# Patient Record
Sex: Male | Born: 1935 | Race: White | Hispanic: No | Marital: Married | Smoking: Former smoker
Health system: Southern US, Community
[De-identification: ages and names within clinical notes are randomized; demographics above are authoritative.]

## PROBLEM LIST (undated history)

## (undated) DIAGNOSIS — Z9889 Other specified postprocedural states: Secondary | ICD-10-CM

## (undated) DIAGNOSIS — I10 Essential (primary) hypertension: Secondary | ICD-10-CM

## (undated) DIAGNOSIS — N390 Urinary tract infection, site not specified: Secondary | ICD-10-CM

## (undated) DIAGNOSIS — N400 Enlarged prostate without lower urinary tract symptoms: Secondary | ICD-10-CM

## (undated) DIAGNOSIS — N529 Male erectile dysfunction, unspecified: Secondary | ICD-10-CM

## (undated) DIAGNOSIS — F329 Major depressive disorder, single episode, unspecified: Secondary | ICD-10-CM

## (undated) DIAGNOSIS — R319 Hematuria, unspecified: Secondary | ICD-10-CM

## (undated) DIAGNOSIS — F32A Depression, unspecified: Secondary | ICD-10-CM

## (undated) DIAGNOSIS — N320 Bladder-neck obstruction: Secondary | ICD-10-CM

## (undated) DIAGNOSIS — I2510 Atherosclerotic heart disease of native coronary artery without angina pectoris: Secondary | ICD-10-CM

## (undated) DIAGNOSIS — F0390 Unspecified dementia without behavioral disturbance: Secondary | ICD-10-CM

## (undated) DIAGNOSIS — G629 Polyneuropathy, unspecified: Secondary | ICD-10-CM

## (undated) DIAGNOSIS — R972 Elevated prostate specific antigen [PSA]: Secondary | ICD-10-CM

## (undated) DIAGNOSIS — R339 Retention of urine, unspecified: Secondary | ICD-10-CM

## (undated) DIAGNOSIS — E119 Type 2 diabetes mellitus without complications: Secondary | ICD-10-CM

## (undated) HISTORY — DX: Essential (primary) hypertension: I10

## (undated) HISTORY — DX: Unspecified dementia, unspecified severity, without behavioral disturbance, psychotic disturbance, mood disturbance, and anxiety: F03.90

## (undated) HISTORY — DX: Male erectile dysfunction, unspecified: N52.9

## (undated) HISTORY — PX: CERVICAL DISC SURGERY: SHX588

## (undated) HISTORY — DX: Retention of urine, unspecified: R33.9

## (undated) HISTORY — DX: Major depressive disorder, single episode, unspecified: F32.9

## (undated) HISTORY — DX: Urinary tract infection, site not specified: N39.0

## (undated) HISTORY — DX: Elevated prostate specific antigen (PSA): R97.20

## (undated) HISTORY — DX: Benign prostatic hyperplasia without lower urinary tract symptoms: N40.0

## (undated) HISTORY — DX: Polyneuropathy, unspecified: G62.9

## (undated) HISTORY — DX: Bladder-neck obstruction: N32.0

## (undated) HISTORY — PX: PROSTATE BIOPSY: SHX241

## (undated) HISTORY — PX: OTHER SURGICAL HISTORY: SHX169

## (undated) HISTORY — DX: Type 2 diabetes mellitus without complications: E11.9

## (undated) HISTORY — PX: CARDIAC SURGERY: SHX584

## (undated) HISTORY — DX: Other specified postprocedural states: Z98.890

## (undated) HISTORY — DX: Hematuria, unspecified: R31.9

## (undated) HISTORY — DX: Atherosclerotic heart disease of native coronary artery without angina pectoris: I25.10

## (undated) HISTORY — DX: Depression, unspecified: F32.A

---

## 2002-09-20 ENCOUNTER — Ambulatory Visit (HOSPITAL_COMMUNITY): Admission: RE | Admit: 2002-09-20 | Discharge: 2002-09-20 | Payer: Self-pay | Admitting: Family Medicine

## 2009-03-10 ENCOUNTER — Ambulatory Visit: Payer: Self-pay

## 2011-12-15 ENCOUNTER — Ambulatory Visit: Payer: Self-pay | Admitting: Urology

## 2012-08-12 LAB — URINALYSIS, COMPLETE
Bacteria: NONE SEEN
Blood: NEGATIVE
Ketone: NEGATIVE
Nitrite: NEGATIVE
Ph: 5 (ref 4.5–8.0)
RBC,UR: 1 /HPF (ref 0–5)
Specific Gravity: 1.011 (ref 1.003–1.030)
Squamous Epithelial: NONE SEEN
WBC UR: 10 /HPF (ref 0–5)

## 2012-08-12 LAB — CBC
MCV: 86 fL (ref 80–100)
Platelet: 263 10*3/uL (ref 150–440)
RBC: 4.87 10*6/uL (ref 4.40–5.90)
RDW: 14.1 % (ref 11.5–14.5)

## 2012-08-12 LAB — PRO B NATRIURETIC PEPTIDE: B-Type Natriuretic Peptide: 1617 pg/mL — ABNORMAL HIGH (ref 0–450)

## 2012-08-12 LAB — BASIC METABOLIC PANEL
Anion Gap: 7 (ref 7–16)
Chloride: 107 mmol/L (ref 98–107)
Co2: 27 mmol/L (ref 21–32)
Creatinine: 1.38 mg/dL — ABNORMAL HIGH (ref 0.60–1.30)
EGFR (Non-African Amer.): 47 — ABNORMAL LOW
Glucose: 136 mg/dL — ABNORMAL HIGH (ref 65–99)

## 2012-08-12 LAB — TROPONIN I: Troponin-I: <0.02 ng/mL

## 2012-08-12 LAB — CK TOTAL AND CKMB (NOT AT ARMC): CK-MB: 1.3 ng/mL (ref 0.5–3.6)

## 2012-08-13 LAB — CBC WITH DIFFERENTIAL/PLATELET
Eosinophil %: 2.4 %
HGB: 11.9 g/dL — ABNORMAL LOW (ref 13.0–18.0)
MCH: 28.3 pg (ref 26.0–34.0)
MCHC: 32.9 g/dL (ref 32.0–36.0)
Neutrophil #: 5.6 10*3/uL (ref 1.4–6.5)
Neutrophil %: 74 %
Platelet: 224 10*3/uL (ref 150–440)
RBC: 4.22 10*6/uL — ABNORMAL LOW (ref 4.40–5.90)
RDW: 14 % (ref 11.5–14.5)

## 2012-08-13 LAB — LIPID PANEL
Cholesterol: 82 mg/dL (ref 0–200)
HDL Cholesterol: 21 mg/dL — ABNORMAL LOW (ref 40–60)
Ldl Cholesterol, Calc: 48 mg/dL (ref 0–100)
Triglycerides: 67 mg/dL (ref 0–200)
VLDL Cholesterol, Calc: 13 mg/dL (ref 5–40)

## 2012-08-13 LAB — BASIC METABOLIC PANEL
BUN: 19 mg/dL — ABNORMAL HIGH (ref 7–18)
Calcium, Total: 9.2 mg/dL (ref 8.5–10.1)
Chloride: 108 mmol/L — ABNORMAL HIGH (ref 98–107)
Co2: 29 mmol/L (ref 21–32)
EGFR (African American): >60
Glucose: 139 mg/dL — ABNORMAL HIGH (ref 65–99)
Sodium: 145 mmol/L (ref 136–145)

## 2012-08-13 LAB — TROPONIN I
Troponin-I: <0.02 ng/mL
Troponin-I: <0.02 ng/mL

## 2012-08-14 ENCOUNTER — Inpatient Hospital Stay: Payer: Self-pay | Admitting: Family Medicine

## 2012-08-14 LAB — CBC WITH DIFFERENTIAL/PLATELET
Basophil #: 0.1 10*3/uL (ref 0.0–0.1)
Eosinophil #: 0.3 10*3/uL (ref 0.0–0.7)
Lymphocyte #: 1.2 10*3/uL (ref 1.0–3.6)
Lymphocyte %: 15.6 %
MCH: 28.2 pg (ref 26.0–34.0)
MCV: 86 fL (ref 80–100)
Monocyte #: 0.6 x10 3/mm (ref 0.2–1.0)
Monocyte %: 7.7 %
Neutrophil #: 5.8 10*3/uL (ref 1.4–6.5)
RBC: 4.3 10*6/uL — ABNORMAL LOW (ref 4.40–5.90)
RDW: 14.1 % (ref 11.5–14.5)

## 2012-08-14 LAB — URINE CULTURE

## 2012-08-14 LAB — BASIC METABOLIC PANEL
Anion Gap: 8 (ref 7–16)
BUN: 19 mg/dL — ABNORMAL HIGH (ref 7–18)
Calcium, Total: 8.9 mg/dL (ref 8.5–10.1)
Co2: 30 mmol/L (ref 21–32)
Creatinine: 1.36 mg/dL — ABNORMAL HIGH (ref 0.60–1.30)
EGFR (Non-African Amer.): 48 — ABNORMAL LOW
Glucose: 102 mg/dL — ABNORMAL HIGH (ref 65–99)
Sodium: 143 mmol/L (ref 136–145)

## 2012-08-14 LAB — HEMOGLOBIN A1C: Hemoglobin A1C: 8 % — ABNORMAL HIGH (ref 4.2–6.3)

## 2012-08-15 LAB — BASIC METABOLIC PANEL
Anion Gap: 10 (ref 7–16)
BUN: 16 mg/dL (ref 7–18)
Chloride: 102 mmol/L (ref 98–107)
Creatinine: 1.01 mg/dL (ref 0.60–1.30)
EGFR (African American): >60
Glucose: 135 mg/dL — ABNORMAL HIGH (ref 65–99)
Osmolality: 283 (ref 275–301)

## 2012-08-17 ENCOUNTER — Emergency Department: Payer: Self-pay | Admitting: Emergency Medicine

## 2012-08-17 LAB — COMPREHENSIVE METABOLIC PANEL
Albumin: 3.1 g/dL — ABNORMAL LOW (ref 3.4–5.0)
Alkaline Phosphatase: 32 U/L — ABNORMAL LOW (ref 50–136)
Anion Gap: 3 — ABNORMAL LOW (ref 7–16)
BUN: 26 mg/dL — ABNORMAL HIGH (ref 7–18)
Bilirubin,Total: 0.4 mg/dL (ref 0.2–1.0)
Chloride: 104 mmol/L (ref 98–107)
Co2: 33 mmol/L — ABNORMAL HIGH (ref 21–32)
Creatinine: 1.06 mg/dL (ref 0.60–1.30)
EGFR (African American): >60
EGFR (Non-African Amer.): >60
Glucose: 206 mg/dL — ABNORMAL HIGH (ref 65–99)
SGOT(AST): 24 U/L (ref 15–37)
SGPT (ALT): 26 U/L (ref 12–78)
Sodium: 140 mmol/L (ref 136–145)
Total Protein: 6.4 g/dL (ref 6.4–8.2)

## 2012-08-17 LAB — URINALYSIS, COMPLETE
RBC,UR: 140946 /HPF (ref 0–5)
Squamous Epithelial: NONE SEEN
WBC UR: 326 /HPF (ref 0–5)

## 2012-08-17 LAB — CBC
HCT: 40.2 % (ref 40.0–52.0)
MCV: 86 fL (ref 80–100)
Platelet: 220 10*3/uL (ref 150–440)
RBC: 4.7 10*6/uL (ref 4.40–5.90)
WBC: 8.7 10*3/uL (ref 3.8–10.6)

## 2012-08-21 ENCOUNTER — Emergency Department: Payer: Self-pay | Admitting: Emergency Medicine

## 2012-08-21 LAB — CBC
HCT: 42.8 % (ref 40.0–52.0)
HGB: 14.2 g/dL (ref 13.0–18.0)
MCH: 28.6 pg (ref 26.0–34.0)
MCHC: 33.2 g/dL (ref 32.0–36.0)
MCV: 86 fL (ref 80–100)
Platelet: 215 10*3/uL (ref 150–440)
RBC: 4.97 10*6/uL (ref 4.40–5.90)
WBC: 9.1 10*3/uL (ref 3.8–10.6)

## 2012-08-21 LAB — COMPREHENSIVE METABOLIC PANEL
BUN: 21 mg/dL — ABNORMAL HIGH (ref 7–18)
Bilirubin,Total: 0.7 mg/dL (ref 0.2–1.0)
Chloride: 108 mmol/L — ABNORMAL HIGH (ref 98–107)
Co2: 25 mmol/L (ref 21–32)
Creatinine: 1.08 mg/dL (ref 0.60–1.30)
Glucose: 153 mg/dL — ABNORMAL HIGH (ref 65–99)
Osmolality: 289 (ref 275–301)
SGOT(AST): 27 U/L (ref 15–37)
Sodium: 142 mmol/L (ref 136–145)

## 2012-08-21 LAB — URINALYSIS, COMPLETE
Hyaline Cast: 13
Nitrite: NEGATIVE
Ph: 5 (ref 4.5–8.0)
RBC,UR: 388 /HPF (ref 0–5)
Specific Gravity: 1.015 (ref 1.003–1.030)
Squamous Epithelial: NONE SEEN
WBC UR: 7 /HPF (ref 0–5)

## 2012-08-23 LAB — URINE CULTURE

## 2012-09-25 ENCOUNTER — Ambulatory Visit: Payer: Self-pay | Admitting: Urology

## 2012-10-15 ENCOUNTER — Ambulatory Visit: Payer: Self-pay | Admitting: Urology

## 2012-10-16 LAB — BASIC METABOLIC PANEL
Anion Gap: 6 — ABNORMAL LOW (ref 7–16)
BUN: 19 mg/dL — ABNORMAL HIGH (ref 7–18)
Chloride: 106 mmol/L (ref 98–107)
Co2: 28 mmol/L (ref 21–32)
Creatinine: 1.12 mg/dL (ref 0.60–1.30)
EGFR (African American): >60
EGFR (Non-African Amer.): >60

## 2012-10-16 LAB — HEMOGLOBIN: HGB: 12.9 g/dL — ABNORMAL LOW (ref 13.0–18.0)

## 2012-10-23 ENCOUNTER — Emergency Department: Payer: Self-pay | Admitting: Emergency Medicine

## 2012-10-23 LAB — URINALYSIS, COMPLETE
Bilirubin,UR: NEGATIVE
Glucose,UR: NEGATIVE mg/dL (ref 0–75)
Ketone: NEGATIVE
Ph: 5 (ref 4.5–8.0)
Protein: 30
RBC,UR: 114 /HPF (ref 0–5)
Specific Gravity: 1.013 (ref 1.003–1.030)
Squamous Epithelial: <1
WBC UR: 62 /HPF (ref 0–5)

## 2012-10-27 ENCOUNTER — Emergency Department: Payer: Self-pay | Admitting: Emergency Medicine

## 2012-10-27 LAB — URINALYSIS, COMPLETE
Ketone: NEGATIVE
Ph: 5 (ref 4.5–8.0)
RBC,UR: 541 /HPF (ref 0–5)
Squamous Epithelial: NONE SEEN
WBC UR: 557 /HPF (ref 0–5)

## 2012-11-21 ENCOUNTER — Ambulatory Visit: Payer: Self-pay | Admitting: Anesthesiology

## 2012-11-21 LAB — BASIC METABOLIC PANEL
Chloride: 107 mmol/L (ref 98–107)
Co2: 26 mmol/L (ref 21–32)
Creatinine: 1 mg/dL (ref 0.60–1.30)
Glucose: 206 mg/dL — ABNORMAL HIGH (ref 65–99)
Osmolality: 287 (ref 275–301)
Potassium: 4.3 mmol/L (ref 3.5–5.1)
Sodium: 139 mmol/L (ref 136–145)

## 2012-11-28 ENCOUNTER — Ambulatory Visit: Payer: Self-pay | Admitting: Urology

## 2012-11-28 LAB — CBC WITH DIFFERENTIAL/PLATELET
Basophil %: 0.3 %
Eosinophil %: 0.1 %
HCT: 41.5 % (ref 40.0–52.0)
HGB: 13.7 g/dL (ref 13.0–18.0)
Lymphocyte #: 0.4 10*3/uL — ABNORMAL LOW (ref 1.0–3.6)
Lymphocyte %: 4.4 %
MCH: 28.9 pg (ref 26.0–34.0)
Neutrophil %: 90.4 %
Platelet: 234 10*3/uL (ref 150–440)
RBC: 4.76 10*6/uL (ref 4.40–5.90)
RDW: 14.5 % (ref 11.5–14.5)
WBC: 9.6 10*3/uL (ref 3.8–10.6)

## 2012-11-28 LAB — COMPREHENSIVE METABOLIC PANEL
Albumin: 3.4 g/dL (ref 3.4–5.0)
Alkaline Phosphatase: 30 U/L — ABNORMAL LOW (ref 50–136)
Anion Gap: 8 (ref 7–16)
BUN: 36 mg/dL — ABNORMAL HIGH (ref 7–18)
Bilirubin,Total: 0.8 mg/dL (ref 0.2–1.0)
Co2: 24 mmol/L (ref 21–32)
Creatinine: 1.66 mg/dL — ABNORMAL HIGH (ref 0.60–1.30)
EGFR (African American): 44 — ABNORMAL LOW
Glucose: 504 mg/dL (ref 65–99)
Potassium: 5.3 mmol/L — ABNORMAL HIGH (ref 3.5–5.1)
SGOT(AST): 14 U/L — ABNORMAL LOW (ref 15–37)
SGPT (ALT): 22 U/L (ref 12–78)
Sodium: 138 mmol/L (ref 136–145)

## 2012-11-28 LAB — CK TOTAL AND CKMB (NOT AT ARMC)
CK, Total: 20 U/L — ABNORMAL LOW (ref 35–232)
CK-MB: 0.8 ng/mL (ref 0.5–3.6)

## 2012-11-28 LAB — PROTIME-INR: INR: 1.1

## 2012-11-29 ENCOUNTER — Inpatient Hospital Stay: Payer: Self-pay | Admitting: Family Medicine

## 2012-11-29 LAB — URINALYSIS, COMPLETE
Glucose,UR: >=500 mg/dL (ref 0–75)
Ketone: NEGATIVE
Ph: 5 (ref 4.5–8.0)
Squamous Epithelial: NONE SEEN

## 2012-11-29 LAB — HEMOGLOBIN A1C: Hemoglobin A1C: 8.6 % — ABNORMAL HIGH (ref 4.2–6.3)

## 2012-11-30 LAB — BASIC METABOLIC PANEL
Anion Gap: 5 — ABNORMAL LOW (ref 7–16)
BUN: 27 mg/dL — ABNORMAL HIGH (ref 7–18)
Chloride: 111 mmol/L — ABNORMAL HIGH (ref 98–107)
Co2: 25 mmol/L (ref 21–32)
Creatinine: 0.95 mg/dL (ref 0.60–1.30)
EGFR (Non-African Amer.): >60
Potassium: 3.9 mmol/L (ref 3.5–5.1)

## 2012-11-30 LAB — CBC WITH DIFFERENTIAL/PLATELET
Eosinophil %: 1.1 %
HCT: 30.8 % — ABNORMAL LOW (ref 40.0–52.0)
HGB: 10.6 g/dL — ABNORMAL LOW (ref 13.0–18.0)
Lymphocyte #: 0.9 10*3/uL — ABNORMAL LOW (ref 1.0–3.6)
Lymphocyte %: 9.9 %
MCH: 29.7 pg (ref 26.0–34.0)
MCHC: 34.3 g/dL (ref 32.0–36.0)
MCV: 87 fL (ref 80–100)
Monocyte #: 0.4 x10 3/mm (ref 0.2–1.0)
Neutrophil #: 7.6 10*3/uL — ABNORMAL HIGH (ref 1.4–6.5)
Neutrophil %: 84.4 %
Platelet: 197 10*3/uL (ref 150–440)
RDW: 14.7 % — ABNORMAL HIGH (ref 11.5–14.5)
WBC: 9 10*3/uL (ref 3.8–10.6)

## 2012-12-01 ENCOUNTER — Ambulatory Visit: Payer: Self-pay | Admitting: Urology

## 2012-12-01 LAB — CBC WITH DIFFERENTIAL/PLATELET
Basophil #: 0.1 10*3/uL (ref 0.0–0.1)
Basophil %: 0.5 %
HCT: 32.3 % — ABNORMAL LOW (ref 40.0–52.0)
Lymphocyte #: 1.1 10*3/uL (ref 1.0–3.6)
Lymphocyte %: 10.6 %
MCH: 30.1 pg (ref 26.0–34.0)
MCV: 86 fL (ref 80–100)
Monocyte #: 0.5 x10 3/mm (ref 0.2–1.0)
Neutrophil #: 8.3 10*3/uL — ABNORMAL HIGH (ref 1.4–6.5)
Platelet: 191 10*3/uL (ref 150–440)
RBC: 3.75 10*6/uL — ABNORMAL LOW (ref 4.40–5.90)

## 2012-12-01 LAB — BASIC METABOLIC PANEL
Anion Gap: 7 (ref 7–16)
BUN: 21 mg/dL — ABNORMAL HIGH (ref 7–18)
Chloride: 109 mmol/L — ABNORMAL HIGH (ref 98–107)
Co2: 25 mmol/L (ref 21–32)
Creatinine: 0.78 mg/dL (ref 0.60–1.30)
EGFR (Non-African Amer.): >60
Osmolality: 286 (ref 275–301)
Potassium: 3.4 mmol/L — ABNORMAL LOW (ref 3.5–5.1)

## 2012-12-02 LAB — BASIC METABOLIC PANEL
BUN: 15 mg/dL (ref 7–18)
Calcium, Total: 7.8 mg/dL — ABNORMAL LOW (ref 8.5–10.1)
Chloride: 109 mmol/L — ABNORMAL HIGH (ref 98–107)
EGFR (African American): >60
Osmolality: 281 (ref 275–301)
Potassium: 3.5 mmol/L (ref 3.5–5.1)
Sodium: 138 mmol/L (ref 136–145)

## 2012-12-02 LAB — CBC WITH DIFFERENTIAL/PLATELET
Basophil #: 0 10*3/uL (ref 0.0–0.1)
Basophil %: 0.6 %
HCT: 30 % — ABNORMAL LOW (ref 40.0–52.0)
HGB: 10.5 g/dL — ABNORMAL LOW (ref 13.0–18.0)
Lymphocyte #: 0.9 10*3/uL — ABNORMAL LOW (ref 1.0–3.6)
MCHC: 34.9 g/dL (ref 32.0–36.0)
MCV: 86 fL (ref 80–100)
Monocyte %: 9.1 %
Neutrophil #: 5 10*3/uL (ref 1.4–6.5)
Platelet: 193 10*3/uL (ref 150–440)
WBC: 6.8 10*3/uL (ref 3.8–10.6)

## 2012-12-02 LAB — URINE CULTURE

## 2012-12-03 LAB — BASIC METABOLIC PANEL
Anion Gap: 4 — ABNORMAL LOW (ref 7–16)
BUN: 11 mg/dL (ref 7–18)
Calcium, Total: 8.2 mg/dL — ABNORMAL LOW (ref 8.5–10.1)
Chloride: 108 mmol/L — ABNORMAL HIGH (ref 98–107)
EGFR (African American): >60
EGFR (Non-African Amer.): >60

## 2012-12-03 LAB — CBC WITH DIFFERENTIAL/PLATELET
Eosinophil #: 0.3 10*3/uL (ref 0.0–0.7)
Eosinophil %: 3.7 %
HCT: 31.9 % — ABNORMAL LOW (ref 40.0–52.0)
HGB: 10.9 g/dL — ABNORMAL LOW (ref 13.0–18.0)
Lymphocyte #: 1.2 10*3/uL (ref 1.0–3.6)
Lymphocyte %: 16.4 %
MCHC: 34.2 g/dL (ref 32.0–36.0)
MCV: 85 fL (ref 80–100)
Monocyte #: 0.8 x10 3/mm (ref 0.2–1.0)
Monocyte %: 10.2 %
Neutrophil %: 68.8 %
Platelet: 222 10*3/uL (ref 150–440)
RBC: 3.76 10*6/uL — ABNORMAL LOW (ref 4.40–5.90)
RDW: 14.2 % (ref 11.5–14.5)
WBC: 7.5 10*3/uL (ref 3.8–10.6)

## 2012-12-04 LAB — BASIC METABOLIC PANEL
Anion Gap: 6 — ABNORMAL LOW (ref 7–16)
BUN: 7 mg/dL (ref 7–18)
Calcium, Total: 8.5 mg/dL (ref 8.5–10.1)
Co2: 28 mmol/L (ref 21–32)
EGFR (Non-African Amer.): >60
Glucose: 110 mg/dL — ABNORMAL HIGH (ref 65–99)
Osmolality: 284 (ref 275–301)
Potassium: 3.4 mmol/L — ABNORMAL LOW (ref 3.5–5.1)
Sodium: 143 mmol/L (ref 136–145)

## 2012-12-05 LAB — BASIC METABOLIC PANEL
BUN: 9 mg/dL (ref 7–18)
Chloride: 104 mmol/L (ref 98–107)
Glucose: 142 mg/dL — ABNORMAL HIGH (ref 65–99)
Osmolality: 277 (ref 275–301)
Potassium: 3.8 mmol/L (ref 3.5–5.1)
Sodium: 138 mmol/L (ref 136–145)

## 2013-10-29 DIAGNOSIS — E669 Obesity, unspecified: Secondary | ICD-10-CM | POA: Insufficient documentation

## 2013-10-29 DIAGNOSIS — E785 Hyperlipidemia, unspecified: Secondary | ICD-10-CM | POA: Insufficient documentation

## 2013-10-29 DIAGNOSIS — E119 Type 2 diabetes mellitus without complications: Secondary | ICD-10-CM | POA: Insufficient documentation

## 2013-10-29 DIAGNOSIS — M5030 Other cervical disc degeneration, unspecified cervical region: Secondary | ICD-10-CM | POA: Insufficient documentation

## 2013-10-29 DIAGNOSIS — G64 Other disorders of peripheral nervous system: Secondary | ICD-10-CM | POA: Insufficient documentation

## 2013-10-29 DIAGNOSIS — N529 Male erectile dysfunction, unspecified: Secondary | ICD-10-CM | POA: Insufficient documentation

## 2013-10-29 DIAGNOSIS — K5790 Diverticulosis of intestine, part unspecified, without perforation or abscess without bleeding: Secondary | ICD-10-CM | POA: Insufficient documentation

## 2013-10-29 DIAGNOSIS — F0390 Unspecified dementia without behavioral disturbance: Secondary | ICD-10-CM | POA: Insufficient documentation

## 2013-10-29 DIAGNOSIS — I25810 Atherosclerosis of coronary artery bypass graft(s) without angina pectoris: Secondary | ICD-10-CM | POA: Insufficient documentation

## 2014-01-01 ENCOUNTER — Emergency Department: Payer: Self-pay | Admitting: Internal Medicine

## 2014-02-10 ENCOUNTER — Ambulatory Visit: Payer: Self-pay | Admitting: Urology

## 2014-02-10 LAB — CBC
HCT: 45.9 % (ref 40.0–52.0)
HGB: 14.6 g/dL (ref 13.0–18.0)
MCH: 28.1 pg (ref 26.0–34.0)
MCHC: 31.8 g/dL — AB (ref 32.0–36.0)
MCV: 88 fL (ref 80–100)
PLATELETS: 224 10*3/uL (ref 150–440)
RBC: 5.19 10*6/uL (ref 4.40–5.90)
RDW: 13.7 % (ref 11.5–14.5)
WBC: 6.6 10*3/uL (ref 3.8–10.6)

## 2014-02-10 LAB — BASIC METABOLIC PANEL
Anion Gap: 5 — ABNORMAL LOW (ref 7–16)
BUN: 16 mg/dL (ref 7–18)
Calcium, Total: 9.1 mg/dL (ref 8.5–10.1)
Chloride: 103 mmol/L (ref 98–107)
Co2: 28 mmol/L (ref 21–32)
Creatinine: 1.24 mg/dL (ref 0.60–1.30)
GFR CALC NON AF AMER: 53 — AB
GLUCOSE: 123 mg/dL — AB (ref 65–99)
Osmolality: 275 (ref 275–301)
POTASSIUM: 4.8 mmol/L (ref 3.5–5.1)
Sodium: 136 mmol/L (ref 136–145)

## 2014-02-17 LAB — COMPREHENSIVE METABOLIC PANEL
ALT: 19 U/L
AST: 16 U/L (ref 15–37)
Albumin: 3.2 g/dL — ABNORMAL LOW (ref 3.4–5.0)
Alkaline Phosphatase: 20 U/L — ABNORMAL LOW
Anion Gap: 9 (ref 7–16)
BUN: 20 mg/dL — ABNORMAL HIGH (ref 7–18)
Bilirubin,Total: 0.7 mg/dL (ref 0.2–1.0)
Calcium, Total: 9 mg/dL (ref 8.5–10.1)
Chloride: 103 mmol/L (ref 98–107)
Co2: 26 mmol/L (ref 21–32)
Creatinine: 1.16 mg/dL (ref 0.60–1.30)
EGFR (Non-African Amer.): 58 — ABNORMAL LOW
GLUCOSE: 99 mg/dL (ref 65–99)
OSMOLALITY: 278 (ref 275–301)
Potassium: 4.3 mmol/L (ref 3.5–5.1)
Sodium: 138 mmol/L (ref 136–145)
TOTAL PROTEIN: 6.7 g/dL (ref 6.4–8.2)

## 2014-02-17 LAB — CBC WITH DIFFERENTIAL/PLATELET
Basophil #: 0.1 10*3/uL (ref 0.0–0.1)
Basophil %: 0.7 %
EOS ABS: 0.1 10*3/uL (ref 0.0–0.7)
EOS PCT: 0.8 %
HCT: 42.4 % (ref 40.0–52.0)
HGB: 13.7 g/dL (ref 13.0–18.0)
Lymphocyte #: 1.2 10*3/uL (ref 1.0–3.6)
Lymphocyte %: 13.6 %
MCH: 28.6 pg (ref 26.0–34.0)
MCHC: 32.4 g/dL (ref 32.0–36.0)
MCV: 88 fL (ref 80–100)
MONOS PCT: 9.2 %
Monocyte #: 0.8 x10 3/mm (ref 0.2–1.0)
Neutrophil #: 6.7 10*3/uL — ABNORMAL HIGH (ref 1.4–6.5)
Neutrophil %: 75.7 %
Platelet: 175 10*3/uL (ref 150–440)
RBC: 4.8 10*6/uL (ref 4.40–5.90)
RDW: 14.1 % (ref 11.5–14.5)
WBC: 8.9 10*3/uL (ref 3.8–10.6)

## 2014-02-17 LAB — TSH: THYROID STIMULATING HORM: 2.48 u[IU]/mL

## 2014-02-17 LAB — TROPONIN I: Troponin-I: <0.02 ng/mL

## 2014-02-18 ENCOUNTER — Inpatient Hospital Stay: Payer: Self-pay | Admitting: Family Medicine

## 2014-02-18 LAB — URINALYSIS, COMPLETE
Bilirubin,UR: NEGATIVE
GLUCOSE, UR: NEGATIVE mg/dL (ref 0–75)
Ketone: NEGATIVE
NITRITE: NEGATIVE
PH: 5 (ref 4.5–8.0)
Protein: 100
RBC,UR: 4879 /HPF (ref 0–5)
Specific Gravity: 1.015 (ref 1.003–1.030)
Squamous Epithelial: NONE SEEN
WBC UR: 96 /HPF (ref 0–5)

## 2014-02-19 LAB — BASIC METABOLIC PANEL
Anion Gap: 9 (ref 7–16)
BUN: 18 mg/dL (ref 7–18)
CALCIUM: 8.6 mg/dL (ref 8.5–10.1)
CO2: 24 mmol/L (ref 21–32)
Chloride: 103 mmol/L (ref 98–107)
Creatinine: 0.94 mg/dL (ref 0.60–1.30)
EGFR (Non-African Amer.): >60
GLUCOSE: 96 mg/dL (ref 65–99)
OSMOLALITY: 274 (ref 275–301)
Potassium: 4 mmol/L (ref 3.5–5.1)
Sodium: 136 mmol/L (ref 136–145)

## 2014-02-19 LAB — CBC WITH DIFFERENTIAL/PLATELET
BASOS ABS: 0.1 10*3/uL (ref 0.0–0.1)
BASOS PCT: 0.7 %
EOS ABS: 0.2 10*3/uL (ref 0.0–0.7)
Eosinophil %: 2.5 %
HCT: 41.2 % (ref 40.0–52.0)
HGB: 13.2 g/dL (ref 13.0–18.0)
LYMPHS ABS: 1.6 10*3/uL (ref 1.0–3.6)
LYMPHS PCT: 20.4 %
MCH: 28.5 pg (ref 26.0–34.0)
MCHC: 32.1 g/dL (ref 32.0–36.0)
MCV: 89 fL (ref 80–100)
Monocyte #: 0.5 x10 3/mm (ref 0.2–1.0)
Monocyte %: 6.3 %
NEUTROS PCT: 70.1 %
Neutrophil #: 5.4 10*3/uL (ref 1.4–6.5)
PLATELETS: 154 10*3/uL (ref 150–440)
RBC: 4.65 10*6/uL (ref 4.40–5.90)
RDW: 14 % (ref 11.5–14.5)
WBC: 7.7 10*3/uL (ref 3.8–10.6)

## 2014-02-20 LAB — CBC WITH DIFFERENTIAL/PLATELET
Basophil #: 0.1 10*3/uL (ref 0.0–0.1)
Basophil %: 0.7 %
Eosinophil #: 0 10*3/uL (ref 0.0–0.7)
Eosinophil %: 0.5 %
HCT: 37.2 % — ABNORMAL LOW (ref 40.0–52.0)
HGB: 12.7 g/dL — ABNORMAL LOW (ref 13.0–18.0)
Lymphocyte #: 1.1 10*3/uL (ref 1.0–3.6)
Lymphocyte %: 15.1 %
MCH: 29.6 pg (ref 26.0–34.0)
MCHC: 34 g/dL (ref 32.0–36.0)
MCV: 87 fL (ref 80–100)
Monocyte #: 1.1 x10 3/mm — ABNORMAL HIGH (ref 0.2–1.0)
Monocyte %: 15.2 %
Neutrophil #: 5 10*3/uL (ref 1.4–6.5)
Neutrophil %: 68.5 %
Platelet: 145 10*3/uL — ABNORMAL LOW (ref 150–440)
RBC: 4.28 10*6/uL — ABNORMAL LOW (ref 4.40–5.90)
RDW: 13.6 % (ref 11.5–14.5)
WBC: 7.3 10*3/uL (ref 3.8–10.6)

## 2014-02-20 LAB — BASIC METABOLIC PANEL
Anion Gap: 7 (ref 7–16)
BUN: 18 mg/dL (ref 7–18)
Calcium, Total: 8.1 mg/dL — ABNORMAL LOW (ref 8.5–10.1)
Chloride: 107 mmol/L (ref 98–107)
Co2: 23 mmol/L (ref 21–32)
Creatinine: 0.93 mg/dL (ref 0.60–1.30)
EGFR (Non-African Amer.): >60
Glucose: 143 mg/dL — ABNORMAL HIGH (ref 65–99)
Osmolality: 278 (ref 275–301)
Potassium: 3.9 mmol/L (ref 3.5–5.1)
Sodium: 137 mmol/L (ref 136–145)

## 2014-02-21 LAB — URINE CULTURE

## 2014-04-08 ENCOUNTER — Ambulatory Visit: Payer: Self-pay | Admitting: Urology

## 2014-04-08 LAB — CBC WITH DIFFERENTIAL/PLATELET
Basophil #: 0.1 10*3/uL (ref 0.0–0.1)
Basophil %: 1 %
Eosinophil #: 0.1 10*3/uL (ref 0.0–0.7)
Eosinophil %: 1.7 %
HCT: 44.3 % (ref 40.0–52.0)
HGB: 14.4 g/dL (ref 13.0–18.0)
LYMPHS ABS: 1.3 10*3/uL (ref 1.0–3.6)
LYMPHS PCT: 18.6 %
MCH: 28.2 pg (ref 26.0–34.0)
MCHC: 32.4 g/dL (ref 32.0–36.0)
MCV: 87 fL (ref 80–100)
Monocyte #: 0.6 x10 3/mm (ref 0.2–1.0)
Monocyte %: 8.1 %
Neutrophil #: 4.9 10*3/uL (ref 1.4–6.5)
Neutrophil %: 70.6 %
PLATELETS: 253 10*3/uL (ref 150–440)
RBC: 5.1 10*6/uL (ref 4.40–5.90)
RDW: 14.5 % (ref 11.5–14.5)
WBC: 7 10*3/uL (ref 3.8–10.6)

## 2014-04-09 ENCOUNTER — Ambulatory Visit: Payer: Self-pay | Admitting: Urology

## 2014-06-02 DIAGNOSIS — I10 Essential (primary) hypertension: Secondary | ICD-10-CM | POA: Insufficient documentation

## 2014-11-07 NOTE — Consult Note (Signed)
Brief Consult Note: Diagnosis: Urinary Retention/SP tube/Penoscrotal edema.   Patient was seen by consultant.   Recommend further assessment or treatment.   Orders entered.   Discussed with Attending MD.   Comments: SP balloon outside of bladder, repositioned by me.  Irrigated easily small clot burden from bladder.  Cont. as is to drainage.  Routine dressing and skin care. Will add in anticholinergic to limit leakage around foley.  D/w Sparks.  Electronic Signatures: Charyl BiggerHouser, Neave Lenger Ross (MD)  (Signed 17-May-14 12:14)  Authored: Brief Consult Note   Last Updated: 17-May-14 12:14 by Charyl BiggerHouser, Gillis Boardley Ross (MD)

## 2014-11-07 NOTE — Consult Note (Signed)
PATIENT NAME:  Zachary [name], HUDNALL MR#:  [id number] DATE OF BIRTH:  07/11/1927  DATE OF CONSULTATION:  12/01/2012  CONSULTING PHYSICIAN:  Tawni Millers. Rahel Carlton, II, MD  REASON FOR CONSULTATION: Penoscrotal edema and leakage around suprapubic tube.   REQUESTING PHYSICIAN:  Dr. Judithann Sheen    HISTORY OF PRESENT ILLNESS:  The patient is an 81 year old male known to [facility]. He has been previously under the care of urologist Dr. Edwyna Shell, who had laser and transurethral resection of the prostate on 10/15/2012, had some postprocedural bleeding and ultimately underwent a suprapubic catheter secondary to urinary retention and outlet obstruction on May 14. At home he had a fever. He was brought back to the [facility] ER after having too high blood sugars and ultimately admitted to the  hospitalist service with blood sugar excess of 500. He is demented and is unable to give his history, but subsequently in the hospital his sugars have been under control, he has been afebrile, he has a normal white count. However, he has been leaking around his catheter and developed a significant amount of penoscrotal edema. On admission, CT scan showed that his suprapubic catheter had been advanced distally too far into the prostate. It was subsequently moved back. I am consulted regarding leakage around the suprapubic tube currently.   PAST MEDICAL HISTORY:   1.  Hypertension. 2.  Hyperlipidemia. 3.  Diabetes mellitus. 4.  Coronary artery disease. 5.  Dementia. 6.  Prostatic hypertrophy. 7.  Abdominal aortic aneurysm.    PAST SURGICAL HISTORY:   1.  Coronary artery bypass grafting.  2.  Ventral hernia.  3.  TURP/TUVP.  4.  Suprapubic tube placement.   ALLERGIES:  No known drug allergies.   OUTPATIENT MEDICATIONS:  1.  Simvastatin.  2.  Potassium.  3.  Metoprolol.  4.  Metformin. 5.  Lisinopril.  6.  Lantus. 7.  Januvia. 8.  Glipizide. 9.  Lasix.  10.  Donepezil.  11.  Bactrim.   SOCIAL HISTORY:   No history of smoking or drinking. He is retired.   FAMILY HISTORY:  Noncontributory.   REVIEW OF SYSTEMS:  The patient denies any pain currently. He denies any suprapubic pain. He denies any history of urinary complaints. Ultimately, his review of systems is unreliable.   PHYSICAL EXAMINATION: GENERAL:  He is an alert Caucasian male, oriented x 1.  CURRENT VITAL SIGNS ARE AS FOLLOWS:  Temperature 36.6, pulse 65, respiratory rate 20, blood pressure 150/78, satting 95% on room air  HEENT:  Normocephalic, atraumatic, nonicteric.  NECK:  Supple, without any lymphadenopathy.  CHEST:  Nonlabored.  CARDIOVASCULAR:  Regular rate and rhythm. Has 2+ upper and lower extremity pulses x 4.  ABDOMEN:  Soft. Mildly distended suprapubically. Suprapubic tube appears to be several centimeters above his suprapubic area. It is not red. He does have a fair amount of soft tissue edema. He is non-peritoneal.  GENITOURINARY:  He has moderate penoscrotal edema.  Unable to retract  the foreksin to visualize the glans. He has moderate scrotal edema as well. Scrotal exam is otherwise unremarkable, with no erythema. No crepitus. He does have a mild amount of soft tissue induration due to the edema.  MUSCULOSKELETAL:  Five out of five strength.  SKIN:  No other rashes or lesions noted, except as listed in the GU exam.  LYMPHATIC:  No lymphadenopathy.  NEUROLOGIC: Cranial nerves appear grossly intact.  EXTREMITIES:  He does have moderate lower extremity edema.   LABORATORY VALUES:  Creatinine is 0.78, white  count is 9.6.   IMAGING: CT scan from admission and CT scan from today: Both report and images are reviewed by myself. CT scan today shows a suprapubic catheter balloon appearing to lie outside the bladder with the distal tip of the catheter in the bladder.   ASSESSMENT:  An 81 year old with history of bladder outlet obstruction and benign prostatic hypertrophy. He is status post transurethral resection of the  prostate and now suprapubic tube. His suprapubic tube was redressed in the ER, and is likely now no longer having the balloon within the bladder, which explains the leakage around the tube, but also complains the normal output, given that the tip with the distal end remains in the bladder. I elected to deflate the tube and advance it 5 cm into the bladder after reducing the balloon. I then reinflated the balloon and it irrigated appropriately. I irrigated it multiple times with 60 mL of sterile saline, resulting  in  getting a small amount of clot burden from the patient. It appears that the suprapubic tube now is within the bladder as appropriate, and the nurse will probably redress this. His penoscrotal edema is consistent with his overall health status and total body fluid overload. There is no evidence of infection or Fournier's. He has no concern for this by history, exam or imaging.   RECOMMENDATIONS ARE AS FOLLOWS:  1.  Suprapubic tube to gravity bag drainage, with routine care.  2.  Will add an anticholinergic for bladder relaxation, as patient with a history of bladder outlet obstruction and leakage around his suprapubic tube.  3.  Continue routine skin care for his penoscrotal edema with scrotal elevation.  This plan was discussed with Dr. Judithann SheenSparks.       ____________________________ Tawni MillersEdward R. Weldon InchesHouser, II, MD erh:mr D: 12/01/2012 12:26:00 ET T: 12/01/2012 20:22:00 ET JOB#: [id number]  cc: Tawni MillersEdward R. Weldon InchesHouser, II, MD, <Dictator> Beaulah CorinEDWARD R Wallace Gappa MD ELECTRONICALLY SIGNED 12/23/2012 11:56

## 2014-11-07 NOTE — Discharge Summary (Signed)
PATIENT NAME:  Zachary [name], Sie A MR#:  [id number] DATE OF BIRTH:  1926-08-31  DATE OF ADMISSION: 08/12/2012  DATE OF DISCHARGE: 08/15/2012   DISCHARGE DIAGNOSES:  1. Urinary retention, requiring Foley catheter placement.  2. Benign prostatic hypertrophy.  3. Urinary tract infection.  4. Systolic congestive heart failure with ejection fraction of 30%. This is a new diagnosis.  5. History of coronary artery disease.  6. Adult-onset diabetes.  7. Dementia.   DISCHARGE MEDICATIONS:  1. Glipizide 10 mg p.o. b.i.d. with meals.  2. Metformin 1000 mg 1 tab with breakfast, 1/2 tablet lunch, 1 tablet dinner.  3. Januvia 100 mg p.o. daily.  4. Niacin 500 mg tabs, 3 tabs p.o. at bedtime.  5. Lisinopril 5 mg p.o. daily.  6. Simvastatin 20 mg p.o. at bedtime.  7. Flomax 0.4 mg p.o. daily.  8. Finasteride 5 mg p.o. daily.  9. Lantus 20 units subcutaneous injection daily.  10. Donepezil 10 mg p.o. at bedtime.  11. Imdur 30 mg p.o. daily.  12. Aspirin 81 mg p.o. at bedtime.  13. Metoprolol 25 mg tabs 1/2-tab p.o. b.i.d.  14. Furosemide 20 mg p.o. daily.  15. Potassium 20 mEq p.o. daily.  16. Levofloxacin 250 mg p.o. daily for 4 more days.  MEDICATIONS TO STOP: The Toviaz and the buffered aspirin.   CONSULTS: None.   PROCEDURES: The patient did have a Foley catheter placed.   PERTINENT LABORATORY: On the day of discharge, sodium 140, potassium 3, BUN of 16, creatinine 1.01, glucose of 135. The patient had an echocardiogram done that showed an EF of 30%. LDL 48, HDL 21. Troponins were negative x3. Urinalysis showed 1+ leukocyte esterase and 10 white blood cell count on micro. Ultrasound was negative for DVT. Renal ultrasound showed mild bilateral hydronephrosis with enlarged prostate and distended bladder.   BRIEF HOSPITAL COURSE:  1. Urinary retention. The patient initially came in with complaints of anasarca and urinary retention with history of BPH and recently placed on Toviaz because of  urge incontinence. The Gala Murdochoviaz was stopped. A Foley catheter was placed, and he produced quite a volume of urine. Continue with his BPH medications. Placed him on Lasix because of an elevated BNP and his diffuse edema, and he diuresed appropriately. Plan is to keep the Foley in place [facility] Dr. Lennie OdorScales. We did give him a trial off of the Foley, and he did continued to retain. His renal ultrasound, as stated above, was consistent with bilateral hydronephrosis with an enlarged prostate and distended bladder. Further treatment pending [facility].  2. UTI. The patient was noted to have urinalysis consistent with a UTI. He was placed on Levaquin with a total dose of 7 days of Levaquin 250 mg daily, likely secondary to the urinary retention.  3. Systolic congestive heart failure with an EF of 30%. This is a new diagnosis. He was found to have generalized edema with elevated BNP. Echocardiogram was performed, and it did show EF of 30%. He is on a beta blocker, an ACE inhibitor and an aspirin, and he is on Lasix for comfort. Will need to cover him with potassium chloride because of his low potassium while he is on the Lasix. He is otherwise stable at this time. I asked his family to monitor the weight, his breathing status and also his fluid status.  4. Other chronic medical issues remain stable at this time. No changes to those regimens.   DISPOSITION: He will need home  health nursing to manage the Foley. Also will need home health PT because of his instability. I did spend greater than 30 minutes speaking with the family, discussing his status and also his home care. They are comfortable with taking him home.  TOTAL DISCHARGE TIME: Greater than 30 minutes.   FOLLOWUP: He is to follow up with [facility] on 08/30/2012 at 2:00 p.m. per Dr. Lennie Odor. He is to follow up with Dr. Burnadette Pop within 2 weeks.    ____________________________ Marisue Ivan, MD kl:OSi D: 08/15/2012 08:12:00  ET T: 08/15/2012 08:25:02 ET JOB#: [id number]  cc: Marisue Ivan, MD, <Dictator> Marisue Ivan MD ELECTRONICALLY SIGNED 09/11/2012 14:21
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
## 2014-11-07 NOTE — Op Note (Signed)
PATIENT NAME:  Zachary [name], Zachary [name] MR#:  [id number] DATE OF BIRTH:  Sep 07, 1926  DATE OF PROCEDURE:  10/15/2012  PREOPERATIVE DIAGNOSIS: Vesicle outlet obstruction secondary to benign prostatic hypertrophy.  POSTOPERATIVE DIAGNOSIS: Vesicle outlet obstruction secondary to benign prostatic hypertrophy.  PROCEDURES PERFORMED: KTP laser, transurethral resection of the prostate and regular resectoscope transurethral resection of the prostate.  SURGEON:  Richard D. Edwyna ShellHart, DO  COMPLICATIONS:  The patient was very, very hemorrhagic throughout the procedure. Every cut I made had 2 or 3 major bleeders so I had [name] limited TURP at best.  ANESTHESIA: General.  PROCEDURE AND FINDINGS:  The patient was sterilely prepped and draped, in the supine lithotomy position, for ease of approach to the external genitalia. The procedure begins.  I insert the KTP laser sheath and scope in an attempt to do KTP laser. With the first 2 passes bleeding occurs and I unable to see. Therefore, I abandon this portion of the procedure and use [name] standard Storz 26 Iglesias resectoscope for resection of the prostate and control of bleeding. Bleeders were controlled and we resect.  Every cut of the 8 or 9 cuts I have to make to open [name] channel had excessive bleeding beyond the normal. The bleeding was controllable, although he just bled every portion of the cut and bled very, very stringently. I was able to control the bleeding without trouble and evacuate what fragments I had removed. The verumontanum was kept in site throughout. There was no incursion upon the veru.  The patient tolerated the procedure well in this form and I inserted [name] 24-French 3-way Foley. I have to put 50 mL in the balloon. I get clear urine after this. So the patient is sent to recovery in satisfactory condition with clear urine. [name] B and O suppository is inserted. The prostate is smooth and I can feel the prostate in the balloon      easily. The patient is sent to  recovery in satisfactory condition with constant bladder irrigation with normal saline through [name] 24-French 3-way standard Foley. ____________________________ Caralyn Guileichard D. Edwyna ShellHart, DO rdh:sb D: 10/15/2012 13:15:40 ET T: 10/15/2012 13:44:39 ET JOB#: [id number]  cc: Caralyn Guileichard D. Edwyna ShellHart, DO, <Dictator> RICHARD D HART DO ELECTRONICALLY SIGNED 11/01/2012 15:15

## 2014-11-07 NOTE — Consult Note (Signed)
PATIENT NAME:  Zachary [name], Zachary [name] MR#:  [id number] DATE OF BIRTH:  1927/05/16  DATE OF CONSULTATION:  10/16/2012  REFERRING PHYSICIAN:   CONSULTING PHYSICIAN:  Caralyn Guileichard D. Edwyna ShellHart, DO  HISTORY: He is postoperative day #1. His glucose is elevated to 309, BUN is 19, creatinine 1.1. Hemoglobin 12.9. He is sitting in the chair stable. Urine is clear. He has clear urine at [name] very low rate of irrigation. We are going to decrease the irrigation today to 0. I will also decrease the balloon size. We are going to need to institute orders for insulin. Otherwise, I am pleased with his progress. He has had to have [name] monitor because he keeps trying to pull his IV and his Foley out. So, he is having [name] person in the room as an aid constantly. Physically, his abdomen is soft. He does not communicate well at all. He has advancing dementia. So, we are going to stop the irrigation today, just put him to gravity drainage, and I will institute some blood glucose monitoring.     ____________________________ Caralyn Guileichard D. Edwyna ShellHart, DO rdh:es D: 10/16/2012 07:40:16 ET T: 10/16/2012 08:37:30 ET JOB#: [id number]  cc: Caralyn Guileichard D. Edwyna ShellHart, DO, <Dictator> Danny Yackley D Alajiah Dutkiewicz DO ELECTRONICALLY SIGNED 11/01/2012 15:15

## 2014-11-07 NOTE — Discharge Summary (Signed)
PATIENT NAME:  Zachary [name], Zachary [name] MR#:  [id number] DATE OF BIRTH:  Jun 02, 1927  DATE OF ADMISSION:  10/15/2012 DATE OF DISCHARGE:  10/17/2012  HOSPITAL COURSE: Second postop day. TURP done on Monday morning.  The patient had quite [name] bit of bleeding during the procedure, but postoperatively he is cleared within 48 hours to gravity drainage. I am going to remove his Foley to see if he can void today. The patient has had 1 minor complication. His temperature is 100.1. I do not know the origin of this.  Lungs have some rales, but the abdomen is soft.  He is awake and in his usual state of non-alertness and slowly advancing dementia. We are going to remove the Foley, give him Rocephin grams 1. We have been trying to control his blood sugars with insulin. We have had some success with this. He continues his potassium. Otherwise, his other vital signs are normal. Abdomen is soft.  There is no distention, no apparent problem, and good urine output of 800 mL per shift.   PLAN:  To remove the Foley today. If he cannot void, put [name] Foley back in and send him back to the nursing home.   ____________________________ Caralyn Guileichard D. Edwyna ShellHart, DO rdh:sb D: 10/17/2012 07:14:00 ET T: 10/17/2012 07:37:14 ET JOB#: [id number]  cc: Caralyn Guileichard D. Edwyna ShellHart, DO, <Dictator> RICHARD D HART DO ELECTRONICALLY SIGNED 11/01/2012 15:15

## 2014-11-07 NOTE — Op Note (Signed)
PATIENT NAME:  Zachary [name], [name] MR#:  [id number] DATE OF BIRTH:  04-Jul-1927  DATE OF PROCEDURE:  11/28/2012  PREOPERATIVE DIAGNOSIS: Chronic urinary retention.  POSTOPERATIVE DIAGNOSES: Chronic urinary retention with vesical outlet obstruction and neurogenic bladder.   PROCEDURE: Cystoscopy, suprapubic cystotomy.  SURGEON:  Shandon Matson D. Edwyna ShellHart, DO  ANESTHESIA: General.   DESCRIPTION OF PROCEDURE: With the patient sterilely prepped and draped in supine lithotomy position, I look in the bladder, fill the bladder with urine through the scope, after emptying the bladder initially. He had quite a bit of pus in his bladder. It is very hard to see in the bladder. I placed a needle in the suprapubic area at a good angle so that it would get urine. Then the suprapubic is placed at the same angle through a Campbell trocar.  The [facility] trocar is difficult to see, and it is because it is very close to the bladder neck. Foley catheter itself extends into the prostatic urethra for a short distance, but it does not go past the verumontanum. The balloon is right at the bladder neck. The patient tolerates this well. Hopefully, he will have minimal spasm from this catheter inside this particular position in the bladder. The trocar is irrigated, 20 mL is left in the Foley balloon. The trocar is then sutured in place and dressings placed around it. The 2-0 Vicryl was used for suturing. He is sent to recovery in satisfactory condition.    ____________________________ Caralyn Guileichard D. Edwyna ShellHart, DO rdh:OSi D: 11/28/2012 08:16:24 ET T: 11/28/2012 08:38:10 ET JOB#: [id number]  cc: Caralyn Guileichard D. Edwyna ShellHart, DO, <Dictator> Fabiana Dromgoole D Gladys Deckard DO ELECTRONICALLY SIGNED 12/13/2012 17:15

## 2014-11-07 NOTE — Discharge Summary (Signed)
PATIENT NAME:  Zachary DunningsLEXANDER, Alecxis A MR#:  [id number] DATE OF BIRTH:  Oct 22, 1926  DATE OF ADMISSION:  10/15/2012 DATE OF DISCHARGE:  10/18/2012  DISCHARGE DIAGNOSIS: Vesicle outlet obstruction secondary to benign prostatic hypertrophy and diabetic neurogenic bladder.   PROCEDURES: 10/15/2012 KTP laser and normal resection prostatectomy.  HOSPITAL COURSE: The patient was admitted after prostatectomy for control of any hemorrhage. He bled quite a bit during surgery, usually more so than normal. Foley catheter was instituted with balloon in the prostatic fossa and controlled bleeding completely. He never had any postoperative bleeding complications, although when the balloon was let down initially he did have some bleeding, so we had to keep the balloon  at 50 mL of fluid and we slowly but surely reduced the balloon size and the bleeding ceased by the 48 hour mark. He had gravity drainage of clear urine at 48 hours and the Foley was removed. He was unable to void and had a 1200 mL residual and Foley catheter was reinstituted. Hospital course was complicated by the patient's agitation and some early dementia, so we had to have a sitter for him. We did not have to use Haldol. Blood sugars were unstable. He was put on insulin and blood sugar checks. Insulin was given appropriately. He was given his potassium. Metoprolol was also given. We held his lisinopril. His antibiotic course was Rocephin grams 1 postoperatively and preoperatively he had Cipro 500. He had 1 slight temperature during his hospitalization at 100.9, but never had another temperature problem. He was more alert and oriented by the third postop day and asked about his Foley catheter, which was the first real conversation I had with the patient all 3 days. I explained to him that we would in the future think of putting suprapubic catheter in with a plug and he would not have to carry a bag with him and be agitated with a Foley catheter in his urethra.  He is very upset about that. Followup is in my office in 10 to 13 days. Go home on his home meds. No antibiotics are necessary at this time as he is afebrile. There is no evidence of infection at time of procedure. He is in satisfactory condition. Abdomen is soft. Bowel sounds present. He is more alert than he has been in 3 days. His lungs are clear to auscultation and he has no problem with penile edema at this time. Foley is draining fairly clear urine. There are a few dried old clot-like brownish material in the Foley, which is just from clot. He is discharged with this Foley in place and followup is in my office 10 to 13 days to discuss suprapubic catheterization procedure.  ____________________________ Caralyn Guileichard D. Edwyna ShellHart, DO rdh:aw D: 10/18/2012 07:34:54 ET T: 10/18/2012 07:58:06 ET JOB#: [id number]  cc: Caralyn Guileichard D. Edwyna ShellHart, DO, <Dictator> Derionna Salvador D Aurorah Schlachter DO ELECTRONICALLY SIGNED 11/01/2012 15:16
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
## 2014-11-07 NOTE — Discharge Summary (Signed)
PATIENT NAME:  [name], [name] MR#:  [id number] DATE OF BIRTH:  August 20, 1926  DATE OF ADMISSION:  11/29/2012 DATE OF DISCHARGE:  12/05/2012  DISCHARGE DIAGNOSES: 1.  Hyperglycemia, resolved. 2.  Urinary retention status post prostatectomy with suprapubic catheter.  3.  History of coronary artery disease.  4.  Adult-onset diabetes mellitus.  5.  Dementia.  6.  Peripheral neuropathy.   DISCHARGE MEDICATIONS: 1.  Simvastatin 20 mg p.o. at bedtime.  2.  Donepezil 10 mg p.o. at bedtime.  3.  Metformin 1000 mg p.o. b.i.d.  4.  Glipizide 10 mg p.o. b.i.d. with meals.  5.  Lisinopril 5 mg p.o. daily.  6.  Januvia 100 mg p.o. daily.  7.  Metoprolol 25 mg 1/2 tab p.o. b.i.d.  8.  Lasix 20 mg p.o. daily.  9.  Klor-Con 20 mEq p.o. daily.  10.  Cefuroxime 250 mg p.o. b.i.d.  End date is 12/08/2012  11.  Nystatin 100,000 units/g topical powder one application topically 3 times a day to affected area.   CONSULTS: Urology.   PROCEDURES: The patient had repositioning of the Foley drain per urology.   BRIEF HOSPITAL COURSE:   PROBLEM #1:  Urinary retention: The patient had known history of urinary retention status post prostatectomy and was admitted for this reason.  Upon CT exam, it looked like his Foley drain was malpositioned. It was corrected urology was consulted who recommended initial repositioning which was completed.  After talking with Dr. Lonna CobbStoioff, he recommended to keep the suprapubic drain in place and placed and then gravity drain, which we will do as an outpatient until he sees Dr. Leonette MonarchHarman or Dr. Edwyna ShellHart.   PROBLEM #2:  Hyperglycemia. The patient initially came in with hyperglycemia, likely due to stress and infection due to urinary tract infection. The patient was given antibiotics and also placed back on his home regimen and his blood sugars are returned to normal limits.   PROBLEM #3:  Other chronic issues are stable. He has moderate dementia and is incapable of walking at this time  without assistance. Therefore, he is being transferred to a skilled nursing facility for further rehab and nursing care for the Foley drain. He needs follow-up with Dr. Orson SlickHarmon or Dr[facility] within a week.  He can follow up with Dr. Burnadette PopLinthavong week one week after discharge from the skilled nursing facility.   Total time of discharge was greater than 30 minutes.     ____________________________ Marisue IvanKanhka Gurtaj Ruz, MD kl:ct D: 12/05/2012 13:17:00 ET T: 12/05/2012 13:33:56 ET JOB#: [id number]  cc: Marisue IvanKanhka Lokelani Lutes, MD, <Dictator> Marisue IvanKANHKA Gedalia Mcmillon MD ELECTRONICALLY SIGNED 12/17/2012 11:17

## 2014-11-07 NOTE — Discharge Summary (Signed)
PATIENT NAME:  Zachary [name], Myers A MR#:  [id number] DATE OF BIRTH:  Jun 12, 1927  DATE OF ADMISSION:  11/28/2012 DATE OF DISCHARGE:  12/05/2012  DISCHARGE DIAGNOSES: 1.  Hyperglycemia, resolved. 2.  Urinary retention status post prostatectomy with suprapubic catheter.  3.  History of coronary artery disease.  4.  Adult-onset diabetes mellitus.  5.  Dementia.  6.  Peripheral neuropathy.   DISCHARGE MEDICATIONS: 1.  Simvastatin 20 mg p.o. at bedtime.  2.  Donepezil 10 mg p.o. at bedtime.  3.  Metformin 1000 mg p.o. b.i.d.  4.  Glipizide 10 mg p.o. b.i.d. with meals.  5.  Lisinopril 5 mg p.o. daily.  6.  Januvia 100 mg p.o. daily.  7.  Metoprolol 25 mg 1/2 tab p.o. b.i.d.  8.  Lasix 20 mg p.o. daily.  9.  Klor-Con 20 mEq p.o. daily.  10.  Cefuroxime 250 mg p.o. b.i.d.  End date is 12/08/2012  11.  Nystatin 100,000 units/g topical powder one application topically 3 times a day to affected area.   CONSULTS: Urology.   PROCEDURES: The patient had repositioning of the Foley drain per urology.   BRIEF HOSPITAL COURSE:   PROBLEM #1:  Urinary retention: The patient had known history of urinary retention status post prostatectomy and was admitted for this reason.  Upon CT exam, it looked like his Foley drain was malpositioned. It was corrected urology was consulted who recommended initial repositioning which was completed.  After talking with Dr. Lonna CobbStoioff, he recommended to keep the suprapubic drain in place and placed and then gravity drain, which we will do as an outpatient until he sees Dr. Leonette MonarchHarman or Dr. Edwyna ShellHart.   PROBLEM #2:  Hyperglycemia. The patient initially came in with hyperglycemia, likely due to stress and infection due to urinary tract infection. The patient was given antibiotics and also placed back on his home regimen and his blood sugars are returned to normal limits.   PROBLEM #3:  Other chronic issues are stable. He has moderate dementia and is incapable of walking at this time  without assistance. Therefore, he is being transferred to a skilled nursing facility for further rehab and nursing care for the Foley drain. He needs follow-up with Dr. Orson SlickHarmon or Dr[facility] within a week.  He can follow up with Dr. Burnadette PopLinthavong week one week after discharge from the skilled nursing facility.   Total time of discharge was greater than 30 minutes.     ____________________________ Marisue IvanKanhka Eleen Litz, MD kl:ct D: 12/05/2012 13:17:07 ET T: 12/05/2012 13:33:56 ET JOB#: [id number]  cc: Marisue IvanKanhka Arlie Posch, MD, <Dictator>

## 2014-11-08 NOTE — Consult Note (Signed)
Brief Consult Note: Diagnosis: BNC/NGB.   Patient was seen by consultant.   Consult note dictated.   Recommend to proceed with surgery or procedure.   Recommend further assessment or treatment.   Orders entered.   Discussed with Attending MD.   Comments: Follow-up with Dr. Hart.  Electronic Edwyna ShellSignatures: Orson ApeWolff, Berley Gambrell R (MD)  (Signed 03-Aug-15 23:42)  Authored: Brief Consult Note   Last Updated: 03-Aug-15 23:42 by Orson ApeWolff, Leniya Breit R (MD)

## 2014-11-08 NOTE — Op Note (Signed)
PATIENT NAME:  Zachary [name], Zachary [name] MR#:  [id number] DATE OF BIRTH:  1926/12/05  DATE OF PROCEDURE:  04/09/2014  PREOPERATIVE DIAGNOSIS: Urinary retention.  POSTOPERATIVE DIAGNOSIS: Urinary retention.  PROCEDURE PERFORMED:   1.  Cystoscopy. 2.  Suprapubic tube placement.   ATTENDING SURGEON:  Claris GladdenAshley J. Vivaan Helseth, MD  ANESTHESIA: General  ESTIMATED BLOOD LOSS:  Minimal.  DRAINS: 16 French suprapubic tube with 10 mL in the balloon.   SPECIMENS:  None.  COMPLICATIONS:  None.  INDICATION:  This is an 81 year old male with chronic urinary retention managed by an indwelling suprapubic tube catheter who some time ago inadvertently removed his suprapubic tube, which was unable to be replaced at that time. Since then he has had several attempted  difficult Foley catheter placements requiring cystoscopy with placement over the wire and  catheter exchanges over [name] wire. He and his daughter would like to have his suprapubic catheter replaced for which he returns today. Risks and benefits of procedure explained in detail. The patient agreed to proceed as planned.   DESCRIPTION OF PROCEDURE: The patient was correctly identified in the preoperative holding area and informed consent was confirmed. He was brought to the operating suite and placed in the table in the supine position. At this time [name] universal timeout protocol was performed. All team members were identified. Venodyne boots were placed and he was administered IV ceftriaxone, fluconazole, and gentamicin in the preoperative holding area within 1 hour of the procedure. Of note, his preoperative urine culture did grow 100,000 Escherichia coli and he has been on Bactrim preoperatively to which his bacteria are sensitive. He also underwent Foley catheter exchange 2 days ago in the office to minimize the bacterial load at the time of the procedure.   He was then placed under general anesthesia, repositioned lower on the bed in the dorsal lithotomy  position, and prepped and draped in the standard surgical fashion. Prior to being prepped and draped, his indwelling penile Foley catheter was removed. At this point, [name] rigid cystoscope was advanced per urethra into the bladder using [name] 22 French access sheath. Of note, within the prostatic urethra, there were numerous healing false passes noted and the contour of the urethra did make an aggressive 90 degree angle turn into the bladder. Inspection of the bladder revealed [name] significant amount of debris and crystals. This was irrigated clear.   Attention was then turned to the anterior surface of the bladder. The midline suprapubic area just above the pubic symphysis was instilled with 10 mL of 0.5% lidocaine for local anesthesia. An 18-gauge finder spinal needle was advanced then through this area into the bladder, which could be visualized directly via the cystoscope. This was then removed and [name] suprapubic introducer sheath and trocar were advanced through [name] stab incision within the suprapubic area into the bladder under direct visualization. The trocar was then removed and [name] 16 [place] Foley was advanced through the sheath into the bladder and the balloon was inflated with 10 mL of sterile water. The trocar was then removed leaving only the Foley catheter in place. The Foley catheter was then secured to the suprapubic area using 2-0 silk sutures. The cystoscope was then removed from the penis and the bladder was drained via the suprapubic tube. There was minimal bleeding noted and the urine was noted to be light pink in color. The patient was then cleaned and dried and dressing of [name] drain sponge was applied to the suprapubic site. The patient was then reversed from  anesthesia and taken to the PACU in stable condition. There were no complications in this case. He will be admitted for overnight observation given his history of sepsis and complications following other endoscopic procedures.     ____________________________ Claris Gladden, MD ajb:LT D: 04/09/2014 16:03:49 ET T: 04/09/2014 21:06:44 ET JOB#: [id number]  cc: Claris Gladden, MD, <Dictator> Claris Gladden MD ELECTRONICALLY SIGNED 04/27/2014 8:51

## 2014-11-08 NOTE — H&P (Signed)
PATIENT NAME:  Zachary [name], Zachary [name] MR#:  [id number] DATE OF BIRTH:  Nov 30, 1926  DATE OF ADMISSION:  02/17/2014  REFERRING PHYSICIAN: Dr. Zenda Alpers   PRIMARY CARE DOCTOR: Dr. Burnadette Pop  ADMITTING DIAGNOSES:  1.  Urinary tract infection.  2.  Weakness.  3.  Dementia.    HISTORY OF PRESENT ILLNESS: This is an 81 year old Caucasian male who was brought to the Emergency Department today for weakness. His daughter, who is one of his primary caretakers, states that the patient had been ambulatory and in a relatively good state of health this morning when she brought him breakfast. By this afternoon, he was unable to walk and appeared to be shaking. There was no loss of consciousness. The patient denied pain. There was no report of fever, nausea, vomiting or diarrhea. The laboratory evaluation revealed a urinary tract infection, and hospitalist staff was contacted for admission.   REVIEW OF SYSTEMS:  GENERAL: Patient denies fever, but admits to weakness.  EYES: The patient denies decrease in visual acuity or inflammation.  ENT: The patient denies sore throat or congestion.  RESPIRATORY: The patient denies cough or shortness of breath. CARDIOVASCULAR: The patient denies chest pain or palpitations.  GASTROINTESTINAL: The patient denies abdominal pain or blood in his stool.  GENITOURINARY: The patient denies dysuria or increased frequency or hesitancy (noted that patient has a Foley catheter currently in place).  ENDOCRINE: The patient denies nocturia or polyuria. HEMATOLOGIC AND LYMPHATIC: The patient denies easy bruising or bleeding. INTEGUMENT: The patient denies rash or lesions.  MUSCULOSKELETAL: The patient denies arthralgias or myalgias.  NEUROLOGIC: The patient denies numbness or paresthesias.  PSYCHIATRIC: The patient denies hallucinations or suicidal ideation.   PAST MEDICAL HISTORY: Significant for coronary artery disease, hyperlipidemia, hypertension, dementia, and abdominal aortic  aneurysm.   PAST SURGICAL HISTORY: The patient has had operations on his cervical spine. He has had quadruple bypass surgery and hernia repairs.   FAMILY HISTORY: No significant history of diabetes, hypertension, or coronary artery disease.   SOCIAL HISTORY: The patient lives with his wife, who also has dementia. He has no history of smoking, drinking, or using drugs. He is a retired [employer].   MEDICATIONS:  1.  Simvastatin 20 mg 1 tab p.o. daily at bedtime.  2.  Metoprolol tartrate 25 mg 1/2 tablet p.o. b.i.d.  3.  Metformin 1000 mg 1 tab p.o. b.i.d. with meals.  4.  Lisinopril 5 mg 1 tab p.o. daily every morning.  5.  Lasix 20 mg 1 tab p.o. daily.  6.  Klor-Con 20 mEq 1 tab p.o. daily.  7.  Januvia 100 mg 1 tab p.o. at bedtime.  8.  Glipizide 10 mg 1 tablet p.o. b.i.d.  9.  Donepezil 10 mg 1 tablet p.o. at bedtime.      ALLERGIES: No known drug allergies.   PERTINENT LABORATORY RESULTS AND RADIOGRAPHIC DATA: Urinalysis shows 96 white blood cells per high-powered field, 2+ leukocyte esterase, and 2+ bacteria. CT of the head shows diffuse atrophy without acute abnormality. Chest x-ray shows no active cardiopulmonary disease.   PHYSICAL EXAMINATION: VITAL SIGNS: Temperature is 98.8, pulse 56, respirations 20, blood pressure 155/72, pulse oximetry 95% on room air.  GENERAL: The patient is oriented to time and place and is somewhat drowsy but easily arousable. He does not appear in any acute distress.  HEENT: Normocephalic, atraumatic. Extraocular movements are intact. Pupils are equal, round and reactive to light and accommodation. Moist mucous membranes.  NECK: Trachea is midline. No adenopathy.  CHEST:  Symmetric and atraumatic.  CARDIOVASCULAR: Regular rate and rhythm. Normal S1, S2. No rubs, clicks, or murmurs. LUNGS: Clear to auscultation bilaterally. Normal effort and excursion.  ABDOMEN: Positive bowel sounds. Soft, nontender, mildly distended, tympanic. There is no  hepatosplenomegaly.  GENITOURINARY: Foley catheter in place. Normal external male genitalia.  MUSCULOSKELETAL: The patient moves all four extremities equally. He is able to stand with some help, and he can take number of steps, assisted.  SKIN: No rashes or lesions. The patient does have some ecchymosis.  EXTREMITIES: No clubbing, cyanosis, or edema.  NEUROLOGIC: Cranial nerves II through XII grossly intact.  PSYCHIATRIC: Patient's mood is normal. Affect is congruent.   ASSESSMENT AND PLAN: This is an 81 year old male with a urinary tract infection and weakness.  1.  Urinary tract infection. No sepsis. Urine culture is obtained. Foley is in place. Ceftriaxone until sensitivities dictate otherwise.  2.  Weakness, likely multifactorial. Plan for discharge has been discussed with the patient's daughter. We will have physical therapy and occupational therapy evaluate the patient while hospitalized. He may benefit from nursing home or rehab on discharge.  3.  Dementia. The patient seems clearheaded when answering questions at this time. His nonfocal weakness earlier in the evening does not appear to has been a transient ischemic attack. CT of the head is negative for stroke or mass. I have discussed at length with the patient's daughter the expected course of dementia and that it is waxing and waning, and may result in some motor weakness and incoordination in the future.  4.  Coronary artery disease, stable. Continue secondary prevention.  5.  Hypertension. Continue lisinopril and metoprolol.  6.  Hyperlipidemia. Continue simvastatin. Upon discharge, we may discuss the utility of continuing this medicine at this patient's age.  7.  Abdominal aortic aneurysm, stable. Will review hospital chart for data regarding size. Manage blood pressure for control.  8.  Diabetes mellitus, type 2. Continue metformin. We will hold sulfonylureas and GLP-1 inhibitors. We will do sliding scale insulin while the patient is  hospitalized.  9.  Deep vein thrombosis prophylaxis. Sequential compression devices.  10.  Gastrointestinal prophylaxis. The patient is not critically ill, and thus this is not required at this time.   CODE STATUS: The patient is full code   TIME SPENT ON ADMISSION ORDERS AND PATIENT CARE: Approximately 1 hour.   ____________________________ Kelton PillarMichael S. Sheryle Hailiamond, MD msd:cg D: 02/18/2014 04:10:43 ET T: 02/18/2014 04:58:13 ET JOB#: [id number]  cc: Kelton PillarMichael S. Sheryle Hailiamond, MD, <Dictator> Kelton PillarMICHAEL S Carisha Kantor MD ELECTRONICALLY SIGNED 02/27/2014 4:36

## 2014-11-08 NOTE — Discharge Summary (Signed)
PATIENT NAME:  Zachary [name], Zachary [name] MR#:  [id number] DATE OF BIRTH:  Aug 10, 1926  DATE OF ADMISSION:  02/18/2014 DATE OF DISCHARGE:  02/20/2014  DISCHARGE DIAGNOSES:  1.  Recurrent urinary tract infection with history of chronic Foley placement.  2.  Adult onset diabetes.  3.  Dementia.  4.  History of coronary artery disease status post coronary artery bypass graft.   DISCHARGE MEDICATIONS:  1.  Simvastatin 20 mg p.o. at bedtime.  2.  Donepezil 10 mg p.o. at bedtime.  3.  Metoprolol tartrate 25 mg 0.5 tablet p.o. b.i.d.  4.  Lasix 20 mg p.o. daily.  5.  Potassium chloride 20 mEq extended release 1 tablet daily.  6.  Lisinopril 5 mg p.o. daily.  7.  Januvia 100 mg p.o. at bedtime.  8.  Metformin 1000 mg p.o. b.i.d. with meals.  9.  Ciprofloxacin 500 mg p.o. b.i.d. x 5 more days.   CONSULTS: Urology.   PROCEDURES: Foley replacement.   PERTINENT LABORATORY DATA AND STUDIES: Urinalysis had leukocyte esterase 2+,  blood culture 3+, urine culture did grow out greater than 100,000 gram-negative rods and 80,000 Serratia, sensitive to Cipro.   On day of discharge: Sodium 136, potassium 4, creatinine 0.94, glucose 96. White blood cell count 7.7, hemoglobin 13.2, platelets 154.   BRIEF HOSPITAL COURSE:  1.  Recurrent urinary tract infection. The patient initially came in with onset of weakness consistent with underlying urinary tract infection that is recurrent with history of Foley placement, initially started on ceftriaxone until the cultures revealed that it was sensitive to Cipro. He was switched over to oral Cipro with which he will be treated for [name] total of 7 days of antibiotic therapy. The patient was also evaluated by physical therapy who noted that patient was extremely weak, in need of further rehabilitation. Therefore, the patient is being sent to [name] for further [facility].   Other chronic issues are stable at this time. Continue with current regimen. Will need urology followup  as an outpatient.    ____________________________ Marisue IvanKanhka Euleta Belson, MD kl:lt D: 02/20/2014 08:45:12 ET T: 02/20/2014 10:17:29 ET JOB#: [id number]  cc: Marisue IvanKanhka Leor Whyte, MD, <Dictator> Marisue IvanKANHKA Mali Eppard MD ELECTRONICALLY SIGNED 03/17/2014 8:24

## 2014-11-08 NOTE — Op Note (Signed)
PATIENT NAME:  Zachary [name], Nature A MR#:  [id number] DATE OF BIRTH:  1927/06/18  DATE OF PROCEDURE:  01/01/2014  PREOPERATIVE DIAGNOSIS: Urinary retention secondary to bladder neck contracture  stricture, and a pulled suprapubic removed 2 days ago accidently by the patient, with no access through the suprapubic.   PROCEDURE: Under local anesthetic is a cystoscopy and placement of Foley catheter over a Glidewire.   SURGEON: Trey Paulaichard Hart, DO  DESCRIPTION OF PROCEDURE: With the patient in supine position, sterilely prepped and draped, I entered the urethra with a flexible scope, placed a wire in the bladder. Removed the scope, and over the 0.035 Glidewire I placed a 16 [place] tip catheter. There is a stricture and a large prostate, of course, but a stricture at the bladder neck. This, of course, allows this patient to have good drainage of a urinary retaining bladder of about 1000 mL. He will be given a gram of Rocephin before leaving. This will be done IM. The patient will be seen in followup for a suprapubic catheter placement in July.      ____________________________ Caralyn Guileichard D. Edwyna ShellHart, DO rdh:mr D: 01/01/2014 17:34:09 ET T: 01/01/2014 19:20:06 ET JOB#: [id number]  cc: Caralyn Guileichard D. Edwyna ShellHart, DO, <Dictator> RICHARD D HART DO ELECTRONICALLY SIGNED 01/31/2014 15:57

## 2014-11-08 NOTE — Op Note (Signed)
PATIENT NAME:  Zachary [name], Zachary [name] MR#:  [id number] DATE OF BIRTH:  Feb 10, 1927  DATE OF PROCEDURE:  02/17/2014  PREOPERATIVE DIAGNOSES:  1.  Bladder neck contracture.  2.  Neurogenic bladder.   POSTOPERATIVE DIAGNOSES:  1.  Bladder neck contracture.  2.  Neurogenic bladder.   PROCEDURE:  1.  Cystoscopy.  2.  Foley catheter placement.   SURGEON: Anola GurneyMichael Rickey Sadowski, M.D.   ANESTHETISEvelene Croon: Alby Schwabe.    ANESTHETIC METHOD: Local with 1% viscous Xylocaine.   INDICATIONS: See the dictated consultation. After informed consent the patient requests the above procedure.   OPERATIVE SUMMARY: The perineum was prepped and draped in the usual fashion. Ten  mL of viscous Xylocaine was instilled into the urethra and the bladder. Penile clamp was placed. An attempt was made to place 6518 JamaicaFrench and 16 [place] Coude catheters but this was not successful. There was some resistance at the bladder neck. The flexible cystoscope was visually advanced into the urethra and then subsequently into the bladder. He had [name] bladder neck contracture which was tight to the scope but allowed passage of the scope. [name] 0.035 guidewire was then advanced through the scope and curled in distal bladder. The cystoscope was then removed. [name] 16 French [place] catheter was then passed over the guidewire and positioned in the bladder. Guidewire was then removed. [name] Foley balloon was then inflated with 30 mL.   The procedure was terminated. The patient tolerated the procedure well.    ____________________________ Suszanne ConnersMichael R. Evelene CroonWolff, MD mrw:lt D: 02/17/2014 23:49:00 ET T: 02/18/2014 08:55:42 ET JOB#: [id number]  cc: Suszanne ConnersMichael R. Evelene CroonWolff, MD, <Dictator> Orson ApeMICHAEL R Avianah Pellman MD ELECTRONICALLY SIGNED 02/18/2014 15:00

## 2014-11-08 NOTE — Consult Note (Signed)
PATIENT NAME:  [name], [name] MR#:  [id number] DATE OF BIRTH:  [birth year]  DATE OF CONSULTATION:  02/17/2014  REQUESTING PHYSICIAN:  Dorothea GlassmanPaul Malinda, MD.  CONSULTING PHYSICIAN:  Suszanne ConnersMichael R. Evelene CroonWolDamita Dunningsff, MD.  REASON FOR CONSULTATION: Inability to place Foley catheter.   HISTORY OF PRESENT ILLNESS: Mr. Zachary [name] is an 81 year old Caucasian male who presented to the Emergency Room with change of mental status. A Foley catheter was removed in the Emergency Room and personnel were unable to replace it. The patient has a past history of BPH and is status post TURP by Dr. Edwyna ShellHart in 2014. He subsequently developed urinary retention and bladder neck contracture. He apparently has had a suprapubic tube in and several Foley catheters in since that time. He has pulled both types of catheters out previously.   ALLERGIES: No drug allergies.   CHRONIC MEDICATIONS: Include Simvastatin, potassium chloride, metoprolol, metformin, lisinopril, insulin, Januvia, glipizide, Lasix, donepezil, and Bactrim.   PREVIOUS SURGICAL PROCEDURES:  1.  TURP with Dr. Edwyna ShellHart 10/15/2012.  2.  Cystoscopy with Foley placement per Dr. Edwyna ShellHart 01/01/2014.  3.  Cystoscopy with suprapubic tube placement, 11/28/2012 with Dr. Edwyna ShellHart.  4.  Coronary artery bypass graft.   5.  Ventral hernia repair.   SOCIAL HISTORY: The patient denied tobacco or alcohol use. He lives at home.   FAMILY HISTORY: Negative for urologic disease.   PAST AND CURRENT MEDICAL CONDITIONS:  1.  Hypertension.  2.  Hyperlipidemia.  3.  Diabetes.  4.  Coronary artery disease.  5.  Dementia.  6.  Aortic aneurysm.   REVIEW OF SYSTEMS:  The patient denied fever or chills or hematuria.   PHYSICAL EXAMINATION:  GENERAL: A chronically ill-appearing white male in no acute distress.  ABDOMEN: Soft. He has a suprapubic scar present. Bladder was not palpably distended. He was uncircumcised. Testes atrophic, 20 mL size each.  RECTAL: 40-gram, slightly nodular prostate.   NEUROMUSCULAR: Alert, but only oriented to person.   IMPRESSION:  1.  Bladder neck contracture.  2.  Benign prostatic hypertrophy status post transurethral resection of the prostate.   3.  Neurogenic bladder.   PLAN:  1.  Several attempts were made to pass 16 [place] and 18 [place] Coude catheters under local anesthesia, but these were not successful.   2.  Using a cystoscope a 16 French [place] catheter was passed - see the operative report.  3.  Follow up with Dr. Edwyna ShellHart and avoid catheter removal until the patient has further evaluation per Dr. Edwyna ShellHart.    ____________________________ Suszanne ConnersMichael R. Evelene CroonWolff, MD mrw:lt D: 02/17/2014 23:46:53 ET T: 02/18/2014 09:28:58 ET JOB#: [id number]  cc: Suszanne ConnersMichael R. Evelene CroonWolff, MD, <Dictator> Orson ApeMICHAEL R Katalin Colledge MD ELECTRONICALLY SIGNED 02/18/2014 15:01

## 2014-12-31 IMAGING — CT CT ABD-PELV W/O CM
1 of 2 series · 15 of 32 positions shown, 19 images · non-contrast
Comparison: none

REASON FOR EXAM: (1) abd. pain; (2) pelvic pain
COMMENTS:

[Series 2: 3mm soft tissue · axial · 0.86mm/px · z∈[-1263,-840]mm · 15 of 153 slices shown, 19 images]
[im 6/153  soft-tissue]
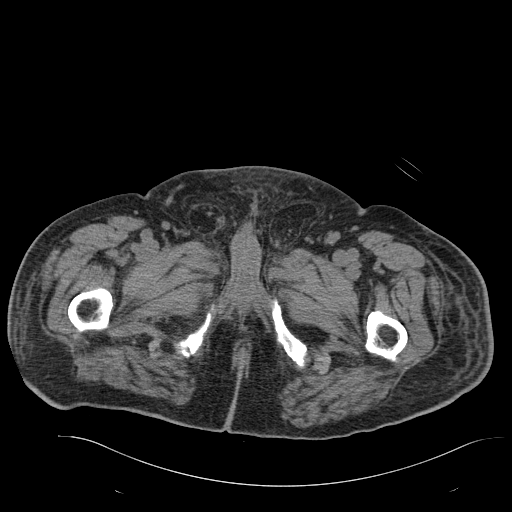
[im 6/153  bone]
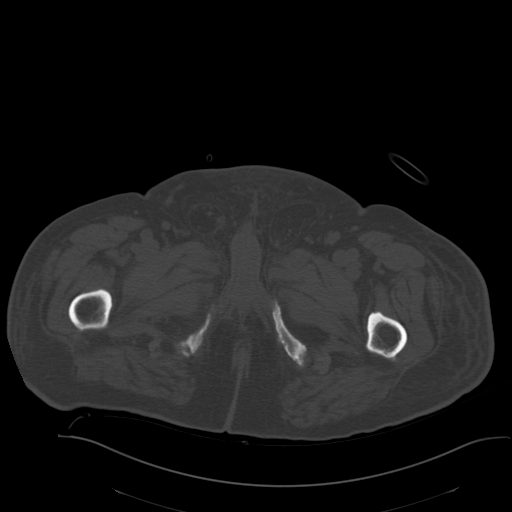
[im 18/153  soft-tissue]
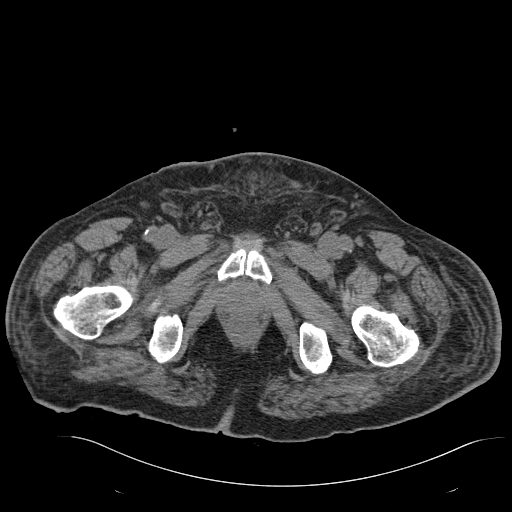
[im 30/153  soft-tissue]
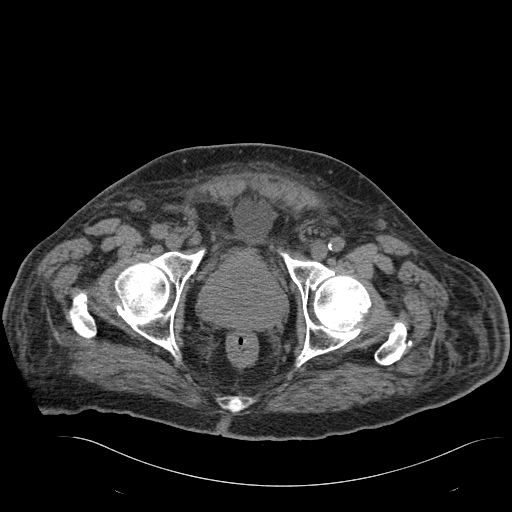
[im 41/153  soft-tissue]
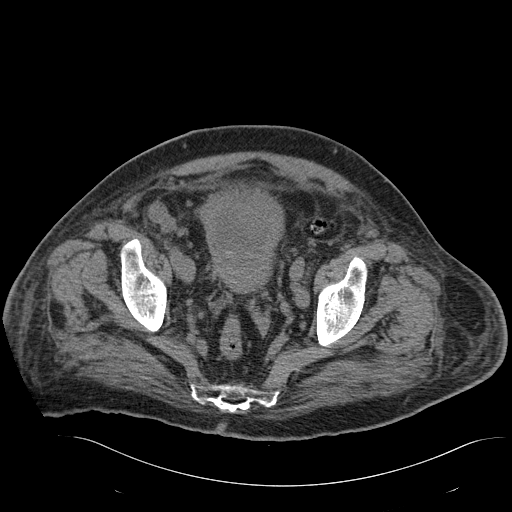
[im 53/153  soft-tissue]
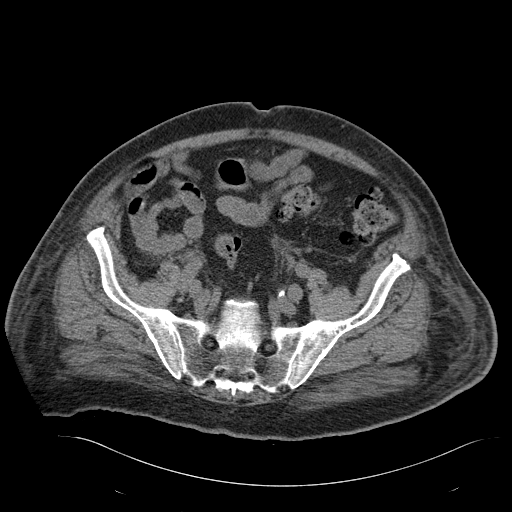
[im 65/153  soft-tissue]
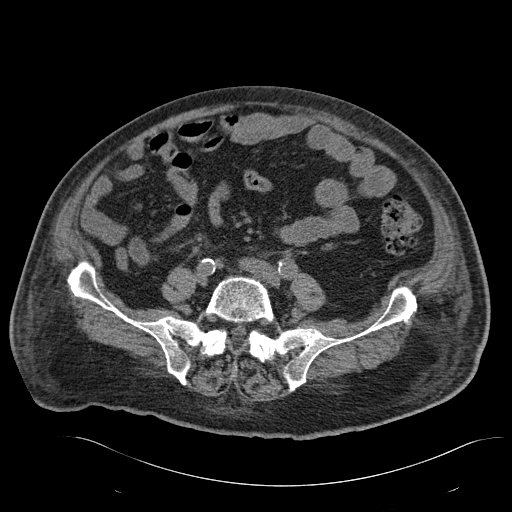
[im 77/153  soft-tissue]
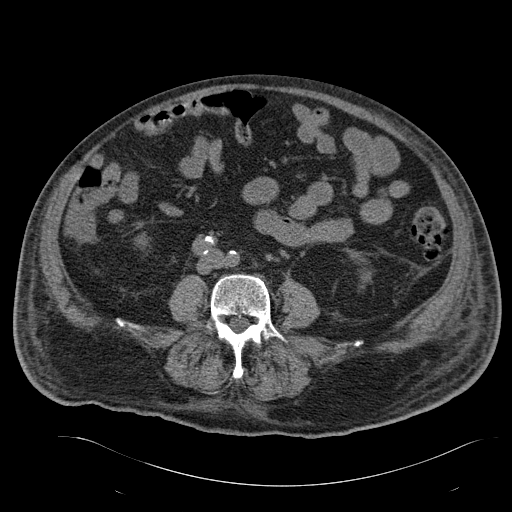
[im 88/153  soft-tissue]
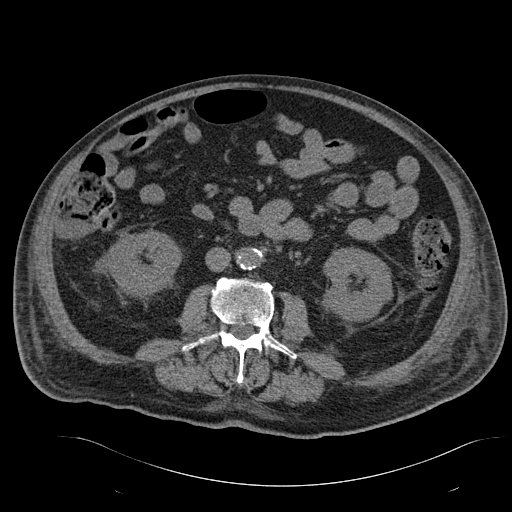
[im 100/153  soft-tissue]
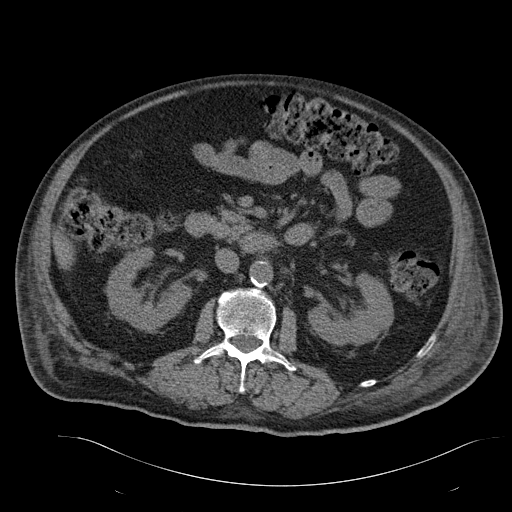
[im 100/153  bone]
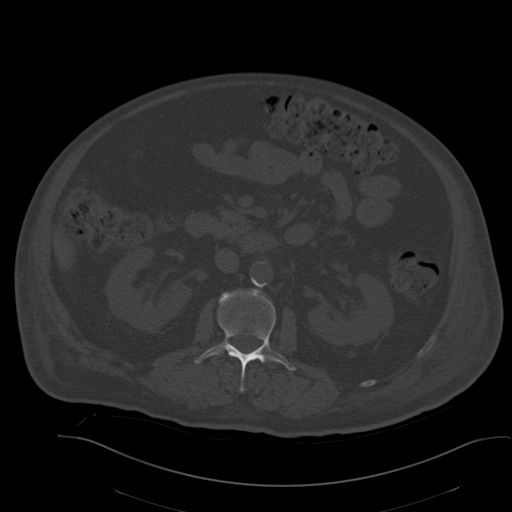
[im 112/153  soft-tissue]
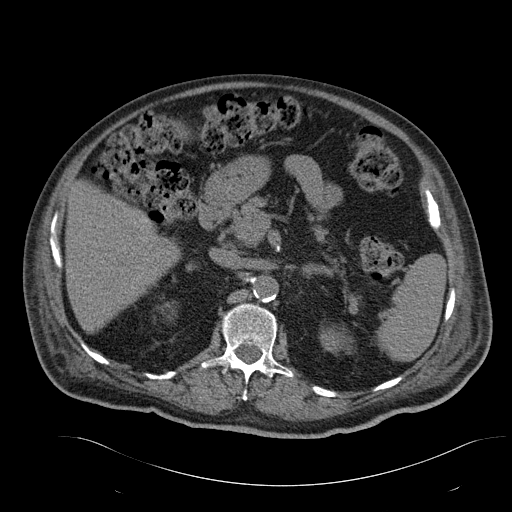
[im 123/153  soft-tissue]
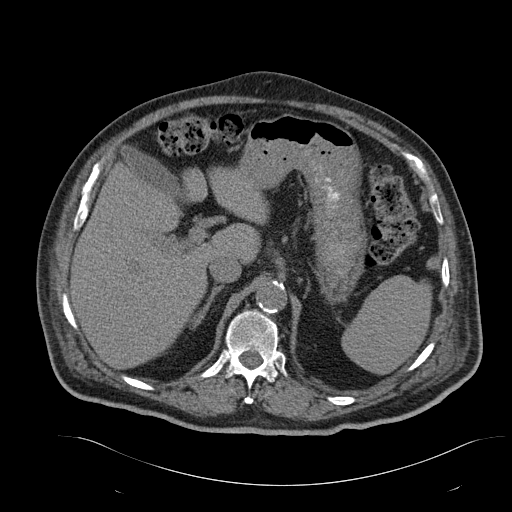
[im 129/153  lung]
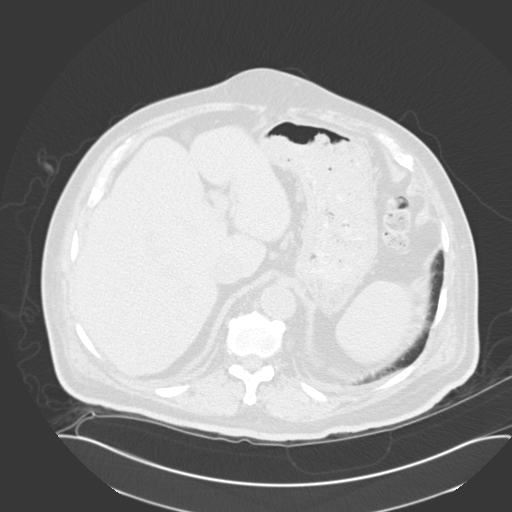
[im 135/153  soft-tissue]
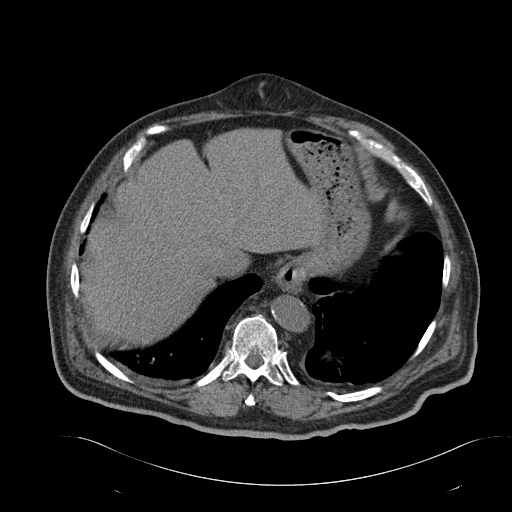
[im 135/153  lung]
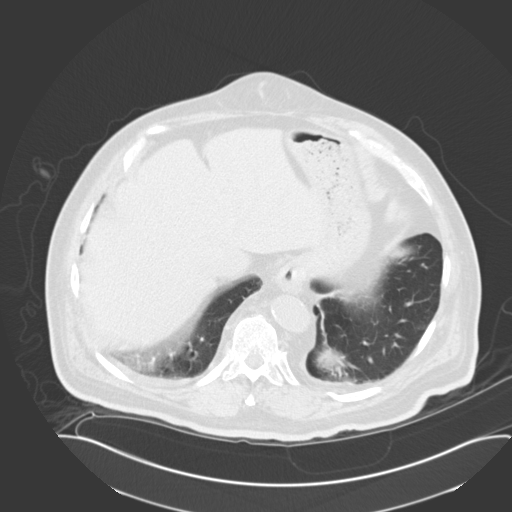
[im 141/153  lung]
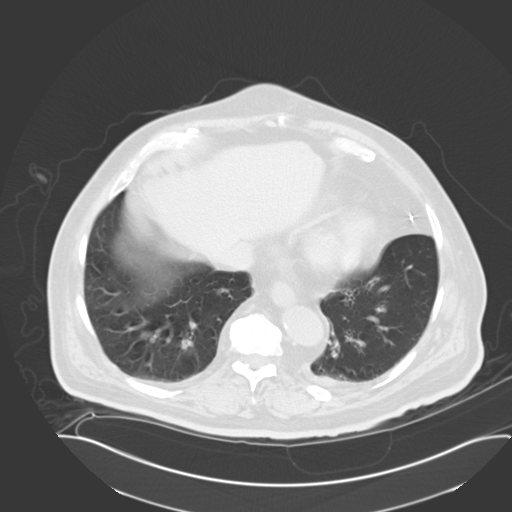
[im 147/153  soft-tissue]
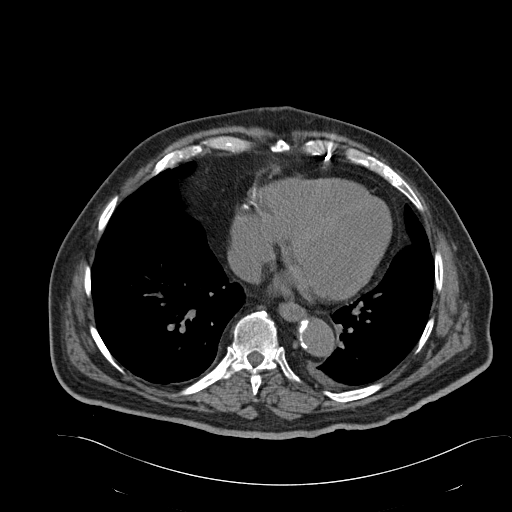
[im 147/153  lung]
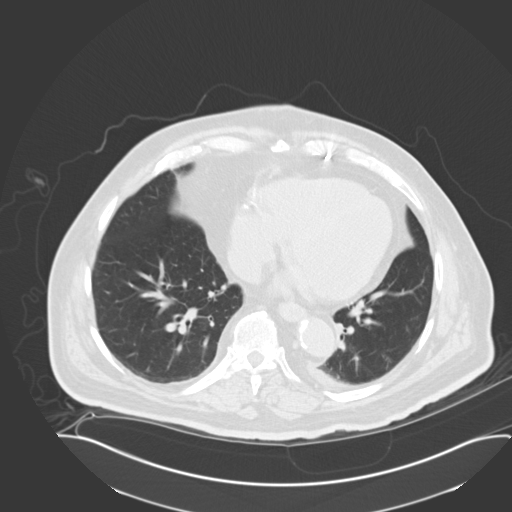

[15 of 32 positions shown; findings below may reference images not displayed]

PROCEDURE:     CT  - CT ABDOMEN AND PELVIS W[DATE]  [DATE]

RESULT:     Axial noncontrast CT scanning was performed through the abdomen
and pelvis.

The suprapubic catheter appears to have been partially withdrawn such that
the balloon lies in the perivesical space inferiorly. The distal 3 cm of the
catheter remains within the bladder. There is a small amount of radiodense
material within the urinary bladder which is not new and is increased in
volume. The urinary bladder itself does not appear abnormally distended.
There is no significant free fluid in the abdomen or pelvis. There has been
some improvement in the appearance of the subcutaneous fat within the pelvic
wall with less edema demonstrated today.

The liver exhibits no focal mass or ductal dilation. The gallbladder is
partially distended and grossly normal. The spleen is not enlarged. The
stomach is partially distended with fluid. There is a small hiatal hernia.
The pancreas, adrenal glands, and kidneys are normal in appearance. The
abdominal aorta exhibits an infrarenal aneurysm which measures 3.8 cm AP x
3.5 cm transversely. There is no evidence of perianeurysmal leakage of blood.

The kidneys exhibit no hydronephrosis. Minimally increased density in the
perinephric fat is present but stable. No obstructing stones are
demonstrated. The unopacified loops of small and large bowel exhibit no
evidence of ileus nor of obstruction.

The lung bases are clear.
IMPRESSION: 1. The suprapubic catheter balloon appears to lie at the extreme edge or
partially outside of the urinary bladder proper. The distal tip of the
catheter remains in the bladder lumen. Deflation of the balloon with
advancement of the catheter by approximately 4 to 5 cm with reinflation of
the balloon is recommended.
2. There is no evidence of hydronephrosis or hydroureter. There is no free
fluid in the abdomen or pelvis.
3. There is no evidence of acute bowel abnormality nor acute hepatobiliary
abnormality.

[REDACTED]

## 2015-01-08 ENCOUNTER — Ambulatory Visit (INDEPENDENT_AMBULATORY_CARE_PROVIDER_SITE_OTHER): Payer: Medicare Other | Admitting: Urology

## 2015-01-08 ENCOUNTER — Encounter: Payer: Self-pay | Admitting: Urology

## 2015-01-08 VITALS — BP 174/77 | HR 49 | Ht 66.0 in | Wt 178.5 lb

## 2015-01-08 DIAGNOSIS — R338 Other retention of urine: Principal | ICD-10-CM

## 2015-01-08 DIAGNOSIS — N400 Enlarged prostate without lower urinary tract symptoms: Secondary | ICD-10-CM

## 2015-01-08 DIAGNOSIS — N401 Enlarged prostate with lower urinary tract symptoms: Secondary | ICD-10-CM

## 2015-01-08 NOTE — Progress Notes (Signed)
Suprapubic Cath Change  Patient is present today for a suprapubic catheter change due to urinary retention.  7ml of water was drained from the balloon, a 16FR foley cath was removed from the tract with out difficulty.  Site was cleaned and prepped in a sterile fashion with betadine.  A 16FR foley cath was replaced into the tract no complications were noted. Urine return was noted, 10 ml of sterile water was inflated into the balloon and tube was plugged. Patient tolerated well.     Preformed by: Dallas Schimke CMA  Follow up: One Month

## 2015-01-12 ENCOUNTER — Encounter: Payer: Self-pay | Admitting: Urology

## 2015-01-12 DIAGNOSIS — R338 Other retention of urine: Principal | ICD-10-CM

## 2015-01-12 DIAGNOSIS — N401 Enlarged prostate with lower urinary tract symptoms: Secondary | ICD-10-CM | POA: Insufficient documentation

## 2015-01-12 NOTE — Progress Notes (Signed)
01/08/2015 10:25 AM   Zachary [name] 10-12-1926 [id number]  Referring provider: No referring provider defined for this encounter.  Chief Complaint  Patient presents with  . Follow-up    one month suprapubic tube replacement    HPI: Mr. Ruesga is an 81 year old white male who presents today for a suprapubic tube exchange. He is not having difficulty with his current suprapubic catheter. He is not having significant drainage around the insertion site nor leakage from the penis.  He is still asking if the suprapubic tube would ever be discontinued.  I believe this is due to his dementia and he does not realize that this is a permanent placement.  The urine in the tube is clear yellow. Patient denies any gross hematuria.  Previous Complaint History: 81 yo M with dementia, urinary retention, h/o bladder neck contracture s/p KTP laser managed with chronic indwelling catheter. He previously had an SPT which was self removed by the patient and unable to be replaced. He underwent replacement of his SPT in 04/09/14 and was admitted overnight for obs/ IV abx. No complications or post op issues. His SPT has been to continuous drainage for the first month but since then, he has been keeping it clamped and opening a few times a day to empty this bladder.      PMH: Past Medical History  Diagnosis Date  . Retention of urine   . HTN (hypertension)   . Hematuria   . DM (diabetes mellitus)   . BPH (benign prostatic hyperplasia)   . ED (erectile dysfunction)   . Elevated PSA   . Recurrent UTI   . Bladder neck contracture   . Coronary disease     Surgical History: Past Surgical History  Procedure Laterality Date  . Cardiac surgery    . Prostate biopsy    . Ktp laser of prostate    . Cervical disc surgery      Home Medications:    Medication List       This list is accurate as of: 01/08/15 11:59 PM.  Always use your most recent med list.               donepezil 10 MG  tablet  Commonly known as:  ARICEPT  Take 10 mg by mouth at bedtime.     furosemide 20 MG tablet  Commonly known as:  LASIX  Take 20 mg by mouth.     glipiZIDE 10 MG tablet  Commonly known as:  GLUCOTROL  Take 10 mg by mouth daily before breakfast.     lisinopril 5 MG tablet  Commonly known as:  PRINIVIL,ZESTRIL  Take 5 mg by mouth daily.     metFORMIN 1000 MG tablet  Commonly known as:  GLUCOPHAGE  Take 1,000 mg by mouth 2 (two) times daily with a meal.     metoprolol succinate 25 MG 24 hr tablet  Commonly known as:  TOPROL-XL  Take 25 mg by mouth daily.     metoprolol tartrate 25 MG tablet  Commonly known as:  LOPRESSOR  Take by mouth.     potassium chloride 20 MEQ packet  Commonly known as:  KLOR-CON  Take by mouth 2 (two) times daily.     potassium chloride SA 20 MEQ tablet  Commonly known as:  K-DUR,KLOR-CON  Take by mouth.     simvastatin 20 MG tablet  Commonly known as:  ZOCOR  Take 20 mg by mouth daily.     sitaGLIPtin 100 MG  tablet  Commonly known as:  JANUVIA  Take 100 mg by mouth daily.        Allergies: No Known Allergies  Family History: Family History  Problem Relation Age of Onset  . Kidney disease Neg Hx   . Prostate cancer Neg Hx     Social History:  reports that he has quit smoking. He does not have any smokeless tobacco history on file. He reports that he does not drink alcohol or use illicit drugs.  ROS: Urological Symptom Review  Patient is experiencing the following symptoms: SPT   Review of Systems  Gastrointestinal (upper)  : Negative for upper GI symptoms  Gastrointestinal (lower) : Negative for lower GI symptoms  Constitutional : Negative for symptoms  Skin: Negative for skin symptoms  Eyes: Negative for eye symptoms  Ear/Nose/Throat : Negative for Ear/Nose/Throat symptoms  Hematologic/Lymphatic: Negative for Hematologic/Lymphatic symptoms  Cardiovascular : Negative for cardiovascular  symptoms  Respiratory : Negative for respiratory symptoms  Endocrine: Negative for endocrine symptoms  Musculoskeletal: Negative for musculoskeletal symptoms  Neurological: Negative for neurological symptoms  Psychologic: Negative for psychiatric symptoms   Physical Exam: BP 174/77 mmHg  Pulse 49  Ht  (1.676 m)  Wt 178 lb 8 oz (80.967 kg)  BMI 28.82 kg/m2  Abdomen:  SPT in place.  No erythema or pus coming from the site.  There is mild "proud flesh" around the catheter.    Laboratory Data: Lab Results  Component Value Date   WBC 7.0 04/08/2014   HGB 14.4 04/08/2014   HCT 44.3 04/08/2014   MCV 87 04/08/2014   PLT 253 04/08/2014    Lab Results  Component Value Date   CREATININE 0.93 02/20/2014    No results found for: PSA  No results found for: TESTOSTERONE  No results found for: HGBA1C  Urinalysis No results found for: COLORURINE, APPEARANCEUR, LABSPEC, PHURINE, GLUCOSEU, HGBUR, BILIRUBINUR, KETONESUR, PROTEINUR, UROBILINOGEN, NITRITE, LEUKOCYTESUR  Pertinent Imaging:   Assessment & Plan:    1. Urinary retention:  Patient's urinary retention is due to bladder outlet obstruction. This is currently being managed by SPT. He presents monthly for SPT exchange. His suprapubic tube was exchanged today without difficulty to the patient. He'll return in 1 month.  There are no diagnoses linked to this encounter.  No Follow-up on file.  Michiel Cowboy, PA-C  [facility] [address] [place], [name] [address] [phone]

## 2015-01-29 DIAGNOSIS — R001 Bradycardia, unspecified: Secondary | ICD-10-CM | POA: Insufficient documentation

## 2015-02-09 ENCOUNTER — Ambulatory Visit: Payer: Medicare Other | Admitting: Urology

## 2015-02-12 ENCOUNTER — Ambulatory Visit: Payer: Medicare Other | Admitting: Urology

## 2015-02-19 ENCOUNTER — Encounter (INDEPENDENT_AMBULATORY_CARE_PROVIDER_SITE_OTHER): Payer: Medicare Other | Admitting: *Deleted

## 2015-02-19 VITALS — BP 168/78 | HR 58 | Resp 18 | Ht 66.0 in

## 2015-02-19 DIAGNOSIS — N401 Enlarged prostate with lower urinary tract symptoms: Secondary | ICD-10-CM

## 2015-02-19 DIAGNOSIS — N400 Enlarged prostate without lower urinary tract symptoms: Secondary | ICD-10-CM

## 2015-02-19 DIAGNOSIS — R338 Other retention of urine: Principal | ICD-10-CM

## 2015-02-19 NOTE — Progress Notes (Signed)
Suprapubic Cath Change  Patient is present today for a suprapubic catheter change due to urinary retention.  10 ml of water was drained from the balloon, a 16FR foley cath was removed from the tract with out difficulty.  Site was cleaned and prepped in a sterile fashion with betadine.  A 16FR foley cath was replaced into the tract no complications were noted. Urine return was noted, 10 ml of sterile water was inflated into the balloon and bag was not attached for drainage; the pt uses a plug.  Patient tolerated well.   Preformed by: Natividad Brood, CMA  This encounter was created in error - please disregard. This encounter was created in error - please disregard.

## 2015-02-26 ENCOUNTER — Ambulatory Visit: Payer: Medicare Other | Admitting: Urology

## 2015-03-19 ENCOUNTER — Ambulatory Visit (INDEPENDENT_AMBULATORY_CARE_PROVIDER_SITE_OTHER): Payer: Medicare Other

## 2015-03-19 DIAGNOSIS — R339 Retention of urine, unspecified: Secondary | ICD-10-CM

## 2015-03-19 NOTE — Progress Notes (Signed)
Suprapubic Cath Change  Patient is present today for a suprapubic catheter change due to urinary retention.  10 ml of water was drained from the balloon, a 20 french foley cath was removed from the tract with a little hesitation.  Site was cleaned and prepped in a sterile fashion with betadine.  A 16 FR foley cath was replaced into the tract no complications were noted. Urine return was noted, 10 ml of sterile water was inflated into the balloon and a plug inserted to stop the drainage.  Patient tolerated well.  Preformed by: Georgiann Hahn, CMA/ Chrissie Noa  Follow up: 1 mth for next catheter change

## 2015-04-23 ENCOUNTER — Ambulatory Visit (INDEPENDENT_AMBULATORY_CARE_PROVIDER_SITE_OTHER): Payer: Medicare Other

## 2015-04-23 DIAGNOSIS — R339 Retention of urine, unspecified: Secondary | ICD-10-CM

## 2015-04-23 NOTE — Progress Notes (Signed)
Suprapubic Cath Change  Patient is present today for a suprapubic catheter change due to urinary retention.  10ml of water was drained from the balloon, a 16FR foley cath was removed from the tract with out difficulty.  Site was cleaned and prepped in a sterile fashion with betadine.  A 16FR foley cath was replaced into the tract no complications were noted. Urine return was noted, 10 ml of sterile water was inflated into the balloon and a plus inserted.  Patient tolerated well. A night bag was given to patient and proper instruction was given on how to switch bags.    Preformed by: Rupert Stacks, LPN

## 2015-05-28 ENCOUNTER — Ambulatory Visit (INDEPENDENT_AMBULATORY_CARE_PROVIDER_SITE_OTHER): Payer: Medicare Other

## 2015-05-28 DIAGNOSIS — R339 Retention of urine, unspecified: Secondary | ICD-10-CM | POA: Diagnosis not present

## 2015-05-28 NOTE — Progress Notes (Signed)
Suprapubic Cath Change  Patient is present today for a suprapubic catheter change due to urinary retention.  10ml of water was drained from the balloon, a 16FR foley cath was removed from the tract with out difficulty.  Site was cleaned and prepped in a sterile fashion with betadine.  A 16FR foley cath was replaced into the tract no complications were noted. Urine return was noted, 10 ml of sterile water was inflated into the balloon and plugged. Pt does not use bags.  Preformed by: Rupert Stackshelsea Watkins, LPN   Follow up: 35mo

## 2015-07-02 ENCOUNTER — Ambulatory Visit: Payer: Medicare Other | Admitting: Obstetrics and Gynecology

## 2015-07-02 ENCOUNTER — Ambulatory Visit: Payer: Medicare Other

## 2015-07-06 ENCOUNTER — Ambulatory Visit (INDEPENDENT_AMBULATORY_CARE_PROVIDER_SITE_OTHER): Payer: Medicare Other | Admitting: Obstetrics and Gynecology

## 2015-07-06 ENCOUNTER — Encounter: Payer: Self-pay | Admitting: Obstetrics and Gynecology

## 2015-07-06 VITALS — BP 149/71 | HR 57 | Resp 16 | Ht 66.0 in | Wt 172.2 lb

## 2015-07-06 DIAGNOSIS — R339 Retention of urine, unspecified: Secondary | ICD-10-CM | POA: Diagnosis not present

## 2015-07-06 NOTE — Progress Notes (Signed)
Suprapubic Cath Change  Patient is present today for a suprapubic catheter change due to urinary retention. 10ml of water was drained from the balloon, a 16FR foley cath was removed from the tract with out difficulty. Site was cleaned and prepped in a sterile fashion with betadine. A 16FR foley cath was replaced into the tract no complications were noted. Urine return was noted, 10 ml of sterile water was inflated into the balloon and plugged. Pt does not use bags.  Preformed by: Natividad BroodSteve Dede Dobesh, CMA  Follow up: 5 weeks

## 2015-07-06 NOTE — Progress Notes (Signed)
12:14 PM   Zachary [name] Feb 23, 1927 [id number]  Referring provider: Marisue Ivan, MD [phone] [facility] [address] [facility] [place], [name] [address]  Chief Complaint  Patient presents with  . Other    SP catheter change    HPI: Zachary [name] is an 81 year old white male  With significant dementia presenting today for his SP tube change. Patient denies any gross hematuria or leakage from penis. He is only able to provide very limited information and is a very poor historian. He does not have family present with him in the exam room today.  Previous Complaint History: 81 yo M with dementia, urinary retention, h/o bladder neck contracture s/p KTP laser managed with chronic indwelling catheter. He previously had an SPT which was self removed by the patient and unable to be replaced. He underwent replacement of his SPT in 04/09/14 and was admitted overnight for obs/ IV abx. No complications or post op issues. His SPT has been to continuous drainage for the first month but since then, he has been keeping it clamped and opening a few times a day to empty this bladder.      PMH: Past Medical History  Diagnosis Date  . Retention of urine   . HTN (hypertension)   . Hematuria   . DM (diabetes mellitus) (HCC)   . BPH (benign prostatic hyperplasia)   . ED (erectile dysfunction)   . Elevated PSA   . Recurrent UTI   . Bladder neck contracture   . Coronary disease     Surgical History: Past Surgical History  Procedure Laterality Date  . Cardiac surgery    . Prostate biopsy    . Ktp laser of prostate    . Cervical disc surgery      Home Medications:    Medication List       This list is accurate as of: 07/06/15 12:14 PM.  Always use your most recent med list.               donepezil 10 MG tablet  Commonly known as:  ARICEPT  Take 10 mg by mouth at bedtime.     furosemide 20 MG tablet  Commonly known as:  LASIX  Take 20 mg by mouth.     glipiZIDE  10 MG tablet  Commonly known as:  GLUCOTROL  Take 10 mg by mouth daily before breakfast.     lisinopril 5 MG tablet  Commonly known as:  PRINIVIL,ZESTRIL  Take 5 mg by mouth daily.     metFORMIN 1000 MG tablet  Commonly known as:  GLUCOPHAGE  Take 1,000 mg by mouth 2 (two) times daily with a meal.     metoprolol succinate 25 MG 24 hr tablet  Commonly known as:  TOPROL-XL  Take 25 mg by mouth daily.     metoprolol tartrate 25 MG tablet  Commonly known as:  LOPRESSOR  Take by mouth.     potassium chloride 20 MEQ packet  Commonly known as:  KLOR-CON  Take by mouth 2 (two) times daily.     potassium chloride SA 20 MEQ tablet  Commonly known as:  K-DUR,KLOR-CON  Take by mouth.     simvastatin 20 MG tablet  Commonly known as:  ZOCOR  Take 20 mg by mouth daily.     sitaGLIPtin 100 MG tablet  Commonly known as:  JANUVIA  Take 100 mg by mouth daily.        Allergies: No Known Allergies  Family  History: Family History  Problem Relation Age of Onset  . Kidney disease Neg Hx   . Prostate cancer Neg Hx     Social History:  reports that he has quit smoking. He does not have any smokeless tobacco history on file. He reports that he does not drink alcohol or use illicit drugs.  ROS: Urological Symptom Review  Patient is experiencing the following symptoms: SPT   Review of Systems  Gastrointestinal (upper)  : Negative for upper GI symptoms  Gastrointestinal (lower) : Negative for lower GI symptoms  Constitutional : Negative for symptoms  Skin: Negative for skin symptoms  Eyes: Negative for eye symptoms  Ear/Nose/Throat : Negative for Ear/Nose/Throat symptoms  Hematologic/Lymphatic: Negative for Hematologic/Lymphatic symptoms  Cardiovascular : Negative for cardiovascular symptoms  Respiratory : Negative for respiratory symptoms  Endocrine: Negative for endocrine symptoms  Musculoskeletal: Negative for musculoskeletal  symptoms  Neurological: Negative for neurological symptoms  Psychologic: Negative for psychiatric symptoms   Physical Exam: BP 149/71 mmHg  Pulse 57  Resp 16  Ht 5\' 6"  (1.676 m)  Wt 172 lb 3.2 oz (78.109 kg)  BMI 27.81 kg/m2  Abdomen:  SPT in place.  No erythema or significant drainage from site     Laboratory Data: Lab Results  Component Value Date   WBC 7.0 04/08/2014   HGB 14.4 04/08/2014   HCT 44.3 04/08/2014   MCV 87 04/08/2014   PLT 253 04/08/2014    Lab Results  Component Value Date   CREATININE 0.93 02/20/2014    No results found for: PSA  No results found for: TESTOSTERONE  Lab Results  Component Value Date   HGBA1C 8.6* 11/29/2012    Urinalysis    Component Value Date/Time   COLORURINE Yellow 02/18/2014 0049   APPEARANCEUR Cloudy 02/18/2014 0049   LABSPEC 1.015 02/18/2014 0049   PHURINE 5.0 02/18/2014 0049   GLUCOSEU Negative 02/18/2014 0049   HGBUR 3+ 02/18/2014 0049   BILIRUBINUR Negative 02/18/2014 0049   KETONESUR Negative 02/18/2014 0049   PROTEINUR 100 mg/dL 16/10/960408/10/2013 [id number]   NITRITE Negative 02/18/2014 0049   LEUKOCYTESUR 2+ 02/18/2014 0049    Pertinent Imaging:   Assessment & Plan:    1. Urinary retention:  Patient's urinary retention is due to bladder outlet obstruction. This is currently being managed by SPT. He presents monthly for SPT exchange. His suprapubic tube was exchanged today without difficulty. Patient tolerated procedure well. He'll return in 1 month.  There are no diagnoses linked to this encounter.  Return in about 1 month (around 08/06/2015).  Earlie LouLindsay Kylo Gavin, FNP  [facility] [address], [address] [place], [place] [address] [phone]

## 2015-08-10 ENCOUNTER — Encounter: Payer: Self-pay | Admitting: Obstetrics and Gynecology

## 2015-08-10 ENCOUNTER — Ambulatory Visit (INDEPENDENT_AMBULATORY_CARE_PROVIDER_SITE_OTHER): Payer: Medicare Other | Admitting: Obstetrics and Gynecology

## 2015-08-10 VITALS — BP 99/63 | HR 60 | Resp 18 | Ht 66.0 in | Wt 164.0 lb

## 2015-08-10 DIAGNOSIS — R339 Retention of urine, unspecified: Secondary | ICD-10-CM

## 2015-08-10 NOTE — Progress Notes (Signed)
Suprapubic Cath Change  Patient is present today for a suprapubic catheter change due to urinary retention.  10ml of water was drained from the balloon, a 16FR foley cath was removed from the tract with out difficulty.  Site was cleaned and prepped in a sterile fashion with betadine.  A 16FR foley cath was replaced into the tract no complications were noted. Urine return was noted, 10 ml of sterile water was inflated into the balloon.  The pt does not use a leg bag but removes a plug from the drain port throughout the day; a new plug was placed.  Patient tolerated well. A night bag was given to patient and proper instruction was given on how to switch bags.    Preformed by: Natividad Brood, CMA  Follow up: One month

## 2015-08-10 NOTE — Progress Notes (Signed)
08/10/2015 3:32 PM   Zachary [name] 02/03/1927 [id number]  Referring provider: Marisue Ivan, MD [phone] [facility] [address] [facility] [place], [name] [address]  Chief Complaint  Patient presents with  . Urinary Retention  . OTHER    SPT change    HPI: 82 yo M with dementia, urinary retention, h/o bladder neck contracture s/p KTP laser managed with chronic indwelling catheter. He previously had an SPT which was self removed by the patient and unable to be replaced. He underwent replacement of his SPT in 04/09/14 and was admitted overnight for obs/ IV abx. No complications or post op issues. His SPT has been to continuous drainage for the first month but since then, he has been keeping it clamped and opening a few times a day to empty this bladder.  Patient denies any gross hematuria or leakage from penis. He is only able to provide very limited information and is a very poor historian. He does not have family present with him in the exam room.  He presents today to have his SP tube changed.  He has not had a cystoscopy in over a year.  PMH: Past Medical History  Diagnosis Date  . Retention of urine   . HTN (hypertension)   . Hematuria   . DM (diabetes mellitus) (HCC)   . BPH (benign prostatic hyperplasia)   . ED (erectile dysfunction)   . Elevated PSA   . Recurrent UTI   . Bladder neck contracture   . Coronary disease     Surgical History: Past Surgical History  Procedure Laterality Date  . Cardiac surgery    . Prostate biopsy    . Ktp laser of prostate    . Cervical disc surgery      Home Medications:    Medication List       This list is accurate as of: 08/10/15  3:32 PM.  Always use your most recent med list.               donepezil 10 MG tablet  Commonly known as:  ARICEPT  Take 10 mg by mouth at bedtime.     fluorouracil 5 % cream  Commonly known as:  EFUDEX  Use twice a day behind left ear for 4 weeks     furosemide 20 MG tablet   Commonly known as:  LASIX  Take 20 mg by mouth.     glipiZIDE 10 MG tablet  Commonly known as:  GLUCOTROL  Take 10 mg by mouth daily before breakfast.     lisinopril 5 MG tablet  Commonly known as:  PRINIVIL,ZESTRIL  Take 5 mg by mouth daily.     metFORMIN 1000 MG tablet  Commonly known as:  GLUCOPHAGE  Take 1,000 mg by mouth 2 (two) times daily with a meal.     potassium chloride SA 20 MEQ tablet  Commonly known as:  K-DUR,KLOR-CON  Take by mouth.     simvastatin 20 MG tablet  Commonly known as:  ZOCOR  Take 20 mg by mouth daily.        Allergies: No Known Allergies  Family History: Family History  Problem Relation Age of Onset  . Kidney disease Neg Hx   . Prostate cancer Neg Hx     Social History:  reports that he has quit smoking. He does not have any smokeless tobacco history on file. He reports that he does not drink alcohol or use illicit drugs.  ROS: UROLOGY Frequent Urination?: Yes Hard  to postpone urination?: No Burning/pain with urination?: No Get up at night to urinate?: No Leakage of urine?: Yes Urine stream starts and stops?: No Trouble starting stream?: No Do you have to strain to urinate?: No Blood in urine?: No Urinary tract infection?: No Sexually transmitted disease?: No Injury to kidneys or bladder?: No Painful intercourse?: No Weak stream?: No Erection problems?: No Penile pain?: No  Gastrointestinal Nausea?: No Vomiting?: No Indigestion/heartburn?: No Diarrhea?: No Constipation?: No  Constitutional Fever: No Night sweats?: No Weight loss?: No Fatigue?: No  Skin Skin rash/lesions?: No Itching?: No  Eyes Blurred vision?: No Double vision?: No  Ears/Nose/Throat Sore throat?: No Sinus problems?: No  Hematologic/Lymphatic Swollen glands?: No Easy bruising?: Yes  Cardiovascular Leg swelling?: No Chest pain?: No  Respiratory Cough?: No Shortness of breath?: Yes  Endocrine Excessive thirst?:  No  Musculoskeletal Back pain?: No Joint pain?: No  Neurological Headaches?: No Dizziness?: No  Psychologic Depression?: No Anxiety?: No  Physical Exam: BP 99/63 mmHg  Pulse 60  Resp 18  Ht  (1.676 m)  Wt 164 lb (74.39 kg)  BMI 26.48 kg/m2  Constitutional:  Alert, No acute distress. Respiratory: Normal respiratory effort, no increased work of breathing. GI: Abdomen is soft, SPT in place, no erythema or drainage, draining light yellow urine Neurologic:  moving all 4 extremities. Psychiatric: Normal mood and affect.  Laboratory Data:   Urinalysis    Component Value Date/Time   COLORURINE Yellow 02/18/2014 0049   APPEARANCEUR Cloudy 02/18/2014 0049   LABSPEC 1.015 02/18/2014 0049   PHURINE 5.0 02/18/2014 0049   GLUCOSEU Negative 02/18/2014 0049   HGBUR 3+ 02/18/2014 0049   BILIRUBINUR Negative 02/18/2014 0049   KETONESUR Negative 02/18/2014 0049   PROTEINUR 100 mg/dL 16/04/9603 5409   NITRITE Negative 02/18/2014 0049   LEUKOCYTESUR 2+ 02/18/2014 0049    Pertinent Imaging:   Assessment & Plan:    1. Urinary retention: Patient's urinary retention is due to bladder outlet obstruction. This is currently being managed by SPT. He presents monthly for SPT exchange. His suprapubic tube was exchanged today without difficulty. Patient tolerated procedure well. He'll return in 1 month for catheter change.  We will also schedule surveillance cystoscopy.   There are no diagnoses linked to this encounter.  Return in about 1 month (around 09/10/2015) for SPT and cystoscopy with MD.  These notes generated with voice recognition software. I apologize for typographical errors.  [name], FNP  [facility] [address] [place], [name] [address] [phone]

## 2015-09-15 ENCOUNTER — Encounter: Payer: Self-pay | Admitting: Urology

## 2015-09-15 ENCOUNTER — Ambulatory Visit (INDEPENDENT_AMBULATORY_CARE_PROVIDER_SITE_OTHER): Payer: Medicare Other | Admitting: Urology

## 2015-09-15 ENCOUNTER — Other Ambulatory Visit: Payer: Medicare Other | Admitting: Urology

## 2015-09-15 VITALS — BP 147/79 | HR 56 | Ht 65.0 in | Wt 163.0 lb

## 2015-09-15 DIAGNOSIS — N400 Enlarged prostate without lower urinary tract symptoms: Secondary | ICD-10-CM | POA: Diagnosis not present

## 2015-09-15 DIAGNOSIS — Z960 Presence of urogenital implants: Secondary | ICD-10-CM

## 2015-09-15 DIAGNOSIS — Z9289 Personal history of other medical treatment: Secondary | ICD-10-CM | POA: Diagnosis not present

## 2015-09-15 DIAGNOSIS — Z978 Presence of other specified devices: Secondary | ICD-10-CM

## 2015-09-15 DIAGNOSIS — R338 Other retention of urine: Principal | ICD-10-CM

## 2015-09-15 DIAGNOSIS — N401 Enlarged prostate with lower urinary tract symptoms: Secondary | ICD-10-CM

## 2015-09-15 DIAGNOSIS — N320 Bladder-neck obstruction: Secondary | ICD-10-CM

## 2015-09-15 MED ORDER — LIDOCAINE HCL 2 % EX GEL: 1.0000 "application " | Freq: Once | CUTANEOUS | Status: DC

## 2015-09-15 MED ORDER — CIPROFLOXACIN HCL 500 MG PO TABS
500.0000 mg | ORAL_TABLET | Freq: Once | ORAL | Status: AC
Start: 2015-09-15 — End: 2015-09-15
  Administered 2015-09-15: 500 mg via ORAL

## 2015-09-15 NOTE — Progress Notes (Signed)
09/15/2015  HPI: 82 year old male with dementia, urinary retention, history of bladder neck contracture status post KTP laser managed with a chronic indwelling suprapubic tube. He presents today for cystoscopy and SP tube exchange.  Cystoscopy Procedure Note  Patient identification was confirmed, informed consent was obtained, and patient was prepped using Betadine solution during mainly his SP tube.  Lidocaine jelly was per the SP tube tract.   Preoperative abx where received prior to procedure.    At this point time, a flexible 16 French cystoscope was advanced through the tract into the bladder. Formal cystoscopy was performed which revealed a mildly trabeculated bladder with patchy erythema on the posterior bladder wall consistent with catheter cystitis. The trigone was in normal anatomic position with bilateral ureteral orifices identified. There was significant intravesical protrusion of the median lobe and elevated bladder neck notable. Retroflexion revealed a normal suprapubic tube tract. There were no papillary tumors, stones, or lesions within the bladder.   At the end of the procedure, a 16 French Foley catheter was replaced per SP tube tract and the balloon was filled with 10 cc of sterile water.  Post-Procedure: - Patient tolerated the procedure well  Assessment:  BPH (benign prostatic hypertrophy) with urinary retention - Plan: ciprofloxacin (CIPRO) tablet 500 mg, DISCONTINUED: lidocaine (XYLOCAINE) 2 % jelly 1 application  Chronic indwelling Foley catheter  Bladder neck contracture   Plan:  f/u with Carollee Herter in 1 month for SPT exchange

## 2015-10-20 ENCOUNTER — Ambulatory Visit: Payer: Medicare Other | Admitting: Urology

## 2015-10-20 ENCOUNTER — Encounter: Payer: Self-pay | Admitting: Urology

## 2015-10-20 ENCOUNTER — Ambulatory Visit (INDEPENDENT_AMBULATORY_CARE_PROVIDER_SITE_OTHER): Payer: Medicare Other | Admitting: Urology

## 2015-10-20 VITALS — BP 123/71 | HR 60 | Ht 67.0 in | Wt 162.5 lb

## 2015-10-20 DIAGNOSIS — R339 Retention of urine, unspecified: Secondary | ICD-10-CM

## 2015-10-20 NOTE — Progress Notes (Signed)
3:23 PM   Zachary [name] 1927/02/22 [id number]  Referring provider: Marisue IvanKanhka Linthavong, MD [phone] [facility] [address] [facility] [place] [place], [place] [id number]  Chief Complaint  Patient presents with  . Urinary Retention    SPT change    HPI: 82 yo M with dementia, urinary retention, h/o bladder neck contracture s/p KTP laser managed with chronic indwelling catheter. He previously had an SPT which was self removed by the patient and unable to be replaced. He underwent replacement of his SPT in 04/09/14 and was admitted overnight for obs/ IV abx. No complications or post op issues. His SPT has been to continuous drainage for the first month but since then, he has been keeping it clamped and opening a few times a day to empty this bladder.  Patient denies any gross hematuria or leakage from penis. He is only able to provide very limited information and is a very poor historian. He does not have family present with him in the exam room.  He presents today to have his SP tube changed.  He had a cystoscopy on 09/15/2015 which was negative for malignancy.  PMH: Past Medical History  Diagnosis Date  . Retention of urine   . HTN (hypertension)   . Hematuria   . DM (diabetes mellitus) (HCC)   . BPH (benign prostatic hyperplasia)   . ED (erectile dysfunction)   . Elevated PSA   . Recurrent UTI   . Bladder neck contracture   . Coronary disease     Surgical History: Past Surgical History  Procedure Laterality Date  . Cardiac surgery    . Prostate biopsy    . Ktp laser of prostate    . Cervical disc surgery      Home Medications:    Medication List       This list is accurate as of: 10/20/15  3:23 PM.  Always use your most recent med list.               donepezil 10 MG tablet  Commonly known as:  ARICEPT  Take 10 mg by mouth at bedtime.     fluorouracil 5 % cream  Commonly known as:  EFUDEX  Reported on 10/20/2015     furosemide 20 MG tablet  Commonly known as:   LASIX  Take 20 mg by mouth.     glipiZIDE 10 MG tablet  Commonly known as:  GLUCOTROL  Take 10 mg by mouth daily before breakfast.     lisinopril 5 MG tablet  Commonly known as:  PRINIVIL,ZESTRIL  Take 5 mg by mouth daily.     metFORMIN 1000 MG tablet  Commonly known as:  GLUCOPHAGE  Take 1,000 mg by mouth 2 (two) times daily with a meal.     potassium chloride SA 20 MEQ tablet  Commonly known as:  K-DUR,KLOR-CON  Take by mouth.     simvastatin 20 MG tablet  Commonly known as:  ZOCOR  Take 20 mg by mouth daily.        Allergies: No Known Allergies  Family History: Family History  Problem Relation Age of Onset  . Kidney disease Neg Hx   . Prostate cancer Neg Hx     Social History:  reports that he has quit smoking. He does not have any smokeless tobacco history on file. He reports that he does not drink alcohol or use illicit drugs.  ROS: UROLOGY Frequent Urination?: Yes Hard to postpone urination?: No Burning/pain with urination?: No Get up  at night to urinate?: No Leakage of urine?: Yes Urine stream starts and stops?: No Trouble starting stream?: No Do you have to strain to urinate?: No Blood in urine?: No Urinary tract infection?: No Sexually transmitted disease?: No Injury to kidneys or bladder?: No Painful intercourse?: No Weak stream?: No Erection problems?: No Penile pain?: No  Gastrointestinal Nausea?: No Vomiting?: No Indigestion/heartburn?: No Diarrhea?: No Constipation?: No  Constitutional Fever: No Night sweats?: No Weight loss?: No Fatigue?: No  Skin Skin rash/lesions?: No Itching?: No  Eyes Blurred vision?: No Double vision?: No  Ears/Nose/Throat Sore throat?: No Sinus problems?: No  Hematologic/Lymphatic Swollen glands?: No Easy bruising?: No  Cardiovascular Leg swelling?: No Chest pain?: No  Respiratory Cough?: No Shortness of breath?: Yes  Endocrine Excessive thirst?: No  Musculoskeletal Back pain?:  No Joint pain?: No  Neurological Headaches?: No Dizziness?: No  Psychologic Depression?: No Anxiety?: No  Physical Exam: BP 123/71 mmHg  Pulse 60  Ht  (1.702 m)  Wt 162 lb 8 oz (73.71 kg)  BMI 25.45 kg/m2  Constitutional:  Alert, No acute distress. Respiratory: Normal respiratory effort, no increased work of breathing. GI: Abdomen is soft, SPT in place, no erythema or drainage, draining light yellow urine Neurologic:  moving all 4 extremities. Psychiatric: Normal mood and affect.    Assessment & Plan:    1. Urinary retention: Patient's urinary retention is due to bladder outlet obstruction. This is currently being managed by SPT. He presents monthly for SPT exchange. His suprapubic tube was exchanged today without difficulty. Patient tolerated procedure well. He'll return in 1 month for catheter change.  We will see if we can arrange home health services to change his suprapubic tubes at home.  We have been unsuccessful in the past in this endeavor.  If we are and able to  arrange home health care, he will return in 1 month for SPT exchange.   Return in about 1 month (around 11/19/2015) for SPT exchange.  These notes generated with voice recognition software. I apologize for typographical errors.  Michiel Cowboy, PA-C  [facility] [address], [address] [place], [name] [phone]

## 2015-10-20 NOTE — Progress Notes (Signed)
Suprapubic Cath Change  Patient is present today for a suprapubic catheter change due to urinary retention.  9ml of water was drained from the balloon, a 16FR foley cath was removed from the tract with out difficulty.  Site was cleaned and prepped in a sterile fashion with betadine.  A 16FR foley cath was replaced into the tract no complications were noted. Urine return was noted, 10 ml of sterile water was inflated into the balloon, bladder was drained and a plug was inserted into end of catheter.  Patient preference. Patient tolerated well.   Preformed by: Michiel CowboyShannon McGowan PA-C and Dallas Schimkeamona Kylar Speelman CMA  Follow up: One month

## 2015-10-22 DIAGNOSIS — R339 Retention of urine, unspecified: Secondary | ICD-10-CM | POA: Insufficient documentation

## 2015-11-24 ENCOUNTER — Ambulatory Visit: Payer: Medicare Other | Admitting: Urology

## 2015-11-26 ENCOUNTER — Ambulatory Visit (INDEPENDENT_AMBULATORY_CARE_PROVIDER_SITE_OTHER): Payer: Medicare Other | Admitting: Urology

## 2015-11-26 ENCOUNTER — Encounter: Payer: Self-pay | Admitting: Urology

## 2015-11-26 VITALS — BP 150/79 | HR 60 | Wt 160.0 lb

## 2015-11-26 DIAGNOSIS — R339 Retention of urine, unspecified: Secondary | ICD-10-CM

## 2015-11-26 NOTE — Progress Notes (Signed)
Suprapubic Cath Change  Patient is present today for a suprapubic catheter change due to urinary retention.  10ml of water was drained from the balloon, a 16FR foley cath was removed from the tract with out difficulty.  Site was cleaned and prepped in a sterile fashion with betadine.  A 16FR foley cath was replaced into the tract no complications were noted. Urine return was noted, 10 ml of sterile water was inflated into the balloon and a plug inserted.  Patient tolerated well.   Preformed by: Rupert Stackshelsea Demetress Tift, LPN

## 2015-11-26 NOTE — Progress Notes (Signed)
4:26 PM   Zachary [name] [birth year] [id number]  Referring provider: Marisue IvanKanhka Linthavong, MD [phone] [facility] [address] Zachary [name], [place] [id number]  Chief Complaint  Patient presents with  . Other    SPT change    HPI: Patient is an 82 year old Caucasian male who presents today with his daughter for SPT exchange.  Background history Patient  with dementia, urinary retention, h/o bladder neck contracture s/p KTP laser managed with chronic indwelling catheter. He previously had an SPT which was self removed by the patient and unable to be replaced. He underwent replacement of his SPT in 04/09/14 and was admitted overnight for obs/ IV abx. No complications or post op issues. His SPT has been to continuous drainage for the first month but since then, he has been keeping it clamped and opening a few times a day to empty his bladder.  Patient denies any gross hematuria or leakage from penis. He is only able to provide very limited information and is a very poor historian. He does not have family present with him in the exam room.  He presents today to have his SP tube changed.  He had a cystoscopy on 09/15/2015 which was negative for malignancy.  PMH: Past Medical History  Diagnosis Date  . Retention of urine   . HTN (hypertension)   . Hematuria   . DM (diabetes mellitus) (HCC)   . BPH (benign prostatic hyperplasia)   . ED (erectile dysfunction)   . Elevated PSA   . Recurrent UTI   . Bladder neck contracture   . Coronary disease     Surgical History: Past Surgical History  Procedure Laterality Date  . Cardiac surgery    . Prostate biopsy    . Ktp laser of prostate    . Cervical disc surgery      Home Medications:    Medication List       This list is accurate as of: 11/26/15  4:26 PM.  Always use your most recent med list.               donepezil 10 MG tablet  Commonly known as:  ARICEPT  Take 10 mg by mouth at bedtime.     fluorouracil 5 %  cream  Commonly known as:  EFUDEX  Reported on 10/20/2015     furosemide 20 MG tablet  Commonly known as:  LASIX  Take 20 mg by mouth.     glipiZIDE 10 MG tablet  Commonly known as:  GLUCOTROL  Take 10 mg by mouth daily before breakfast.     lisinopril 5 MG tablet  Commonly known as:  PRINIVIL,ZESTRIL  Take 5 mg by mouth daily.     metFORMIN 1000 MG tablet  Commonly known as:  GLUCOPHAGE  Take 1,000 mg by mouth 2 (two) times daily with a meal.     potassium chloride SA 20 MEQ tablet  Commonly known as:  K-DUR,KLOR-CON  Take by mouth.     simvastatin 20 MG tablet  Commonly known as:  ZOCOR  Take 20 mg by mouth daily.        Allergies: No Known Allergies  Family History: Family History  Problem Relation Age of Onset  . Kidney disease Neg Hx   . Prostate cancer Neg Hx     Social History:  reports that he has quit smoking. He does not have any smokeless tobacco history on file. He reports that he does not drink alcohol or use illicit drugs.  ROS: UROLOGY Frequent Urination?: Yes Hard to postpone urination?: No Burning/pain with urination?: No Get up at night to urinate?: No Leakage of urine?: Yes Urine stream starts and stops?: No Trouble starting stream?: No Do you have to strain to urinate?: No Blood in urine?: No Urinary tract infection?: No Sexually transmitted disease?: No Injury to kidneys or bladder?: No Painful intercourse?: No Weak stream?: No Erection problems?: No Penile pain?: No  Gastrointestinal Nausea?: No Vomiting?: No Indigestion/heartburn?: No Diarrhea?: No Constipation?: No  Constitutional Fever: No Night sweats?: No Weight loss?: No Fatigue?: No  Skin Skin rash/lesions?: No Itching?: No  Eyes Blurred vision?: No Double vision?: No  Ears/Nose/Throat Sore throat?: No Sinus problems?: No  Hematologic/Lymphatic Swollen glands?: No Easy bruising?: No  Cardiovascular Leg swelling?: No Chest pain?:  No  Respiratory Cough?: No Shortness of breath?: No  Endocrine Excessive thirst?: No  Musculoskeletal Back pain?: No Joint pain?: No  Neurological Headaches?: No Dizziness?: No  Psychologic Depression?: No Anxiety?: No  Physical Exam: BP 150/79 mmHg  Pulse 60  Wt 160 lb (72.576 kg)  PF 56 L/min  Constitutional:  Alert, No acute distress. Respiratory: Normal respiratory effort, no increased work of breathing. GI: Abdomen is soft, SPT in place, no erythema or drainage, draining light yellow urine Neurologic:  moving all 4 extremities. Psychiatric: Normal mood and affect.   Assessment & Plan:    1. Urinary retention: Patient's urinary retention is due to bladder outlet obstruction. This is currently being managed by SPT. He presents monthly for SPT exchange. His suprapubic tube was exchanged today without difficulty. Patient tolerated procedure well. He'll return in 6 weeks for catheter change.  We're unable to obtain home health for the patient. The daughter states that she is comfortable exchanging the suprapubic tube at home if we can provide the supplies. We will contact our catheter companies to see if they can help. She will return with Zachary [name] in 6 weeks and we will instruct her on the procedure to exchange the SPT   Return in about 6 weeks (around 01/07/2016) for SPT exchange.  These notes generated with voice recognition software. I apologize for typographical errors.  Michiel Cowboy, PA-C  [facility] [address] [place], [name] [phone]

## 2015-12-01 ENCOUNTER — Telehealth: Payer: Self-pay | Admitting: Urology

## 2015-12-01 NOTE — Telephone Encounter (Signed)
Would you contact [facility] and ask if they can help him with SPT supplies so that his daughter can exchange the tube at home?

## 2015-12-03 NOTE — Telephone Encounter (Signed)
LMOM

## 2015-12-07 NOTE — Telephone Encounter (Signed)
[facility] is going to send pt/daughter supplies.

## 2016-01-07 ENCOUNTER — Ambulatory Visit: Payer: Medicare Other | Admitting: Urology

## 2016-03-01 ENCOUNTER — Telehealth: Payer: Self-pay

## 2016-03-01 NOTE — Telephone Encounter (Signed)
Nurse from pt facility called for verbal orders of pt SPT. Verbal orders were given.

## 2016-03-10 ENCOUNTER — Telehealth: Payer: Self-pay | Admitting: *Deleted

## 2016-03-10 NOTE — Telephone Encounter (Signed)
Spoke with nurse at facility. I spoke to her about the orders to change his SPT tube. Because of patient keeping his tube just plugged and drained she just needs to use a 16 Foley cath for his SPT tube and that it needs to be changed every thirty days. Nurse states she understands now.

## 2016-09-05 ENCOUNTER — Encounter: Payer: Self-pay | Admitting: Urology

## 2016-09-05 ENCOUNTER — Ambulatory Visit (INDEPENDENT_AMBULATORY_CARE_PROVIDER_SITE_OTHER): Payer: Medicare Other | Admitting: Urology

## 2016-09-05 VITALS — BP 161/82 | HR 50 | Ht 67.0 in

## 2016-09-05 DIAGNOSIS — R339 Retention of urine, unspecified: Secondary | ICD-10-CM

## 2016-09-05 NOTE — Progress Notes (Signed)
Suprapubic Cath Change  Patient is present today for a suprapubic catheter change due to urinary retention.  6ml of water was drained from the balloon, a 16FR foley cath was removed from the tract with out difficulty.  Site was cleaned and prepped in a sterile fashion with betadine.  A 16FR foley cath was replaced into the tract complications were noted as: encrustation of the suprapubic tube. Michiel CowboyShannon McGowan PAC was called into the room to assist in removing the encrusted catheter. Michiel CowboyShannon McGowan, PAC was able to remove cath after some manipulation no blood was noted, patient tolerated well.. Urine return was noted and placement was confirmed with irrigation of 30ml of sterile water, 10 ml of sterile water was inflated into the balloon and a cath plug was placed no bag.  Patient tolerated well. Instruction was given for plug to stay in place and for patient to try and urinate on his own. Orders were given to caregiver to take back to patient's home.   Preformed by: Eligha BridegroomSarah Elya Tarquinio, CMA   Follow up: 2weeks

## 2016-09-05 NOTE — Progress Notes (Signed)
2:52 PM   AVYUKT CIMO October 11, 1926 [id number]  Referring provider: Marisue Ivan, MD [phone] [facility] [address] [facility] [place], [name] [address]  Chief Complaint  Patient presents with  . Urinary Retention    HPI: Patient is an 83 year old WM who presents today with to discuss removal of the SPT tube.    Patient has a history of a bladder neck contracture and has a SPT in place to manage his urinary retention.  According to the facility, [facility], they are requesting possible removal of the SPT tube as he is peeing from his penis.    Patient is a poor historian.  He caregiver with him today has only worked with him for only one day.  I cannot get a historian from either individual.    PMH: Past Medical History:  Diagnosis Date  . Bladder neck contracture   . BPH (benign prostatic hyperplasia)   . Coronary disease   . DM (diabetes mellitus) (HCC)   . ED (erectile dysfunction)   . Elevated PSA   . Hematuria   . HTN (hypertension)   . Recurrent UTI   . Retention of urine     Surgical History: Past Surgical History:  Procedure Laterality Date  . CARDIAC SURGERY    . CERVICAL DISC SURGERY    . ktp laser of prostate    . PROSTATE BIOPSY      Home Medications:  Allergies as of 2020/11/2416   No Known Allergies     Medication List       Accurate as of 09/05/16  2:52 PM. Always use your most recent med list.          donepezil 10 MG tablet Commonly known as:  ARICEPT Take 10 mg by mouth at bedtime.   fluorouracil 5 % cream Commonly known as:  EFUDEX Reported on 10/20/2015   furosemide 20 MG tablet Commonly known as:  LASIX Take 20 mg by mouth.   glipiZIDE 10 MG tablet Commonly known as:  GLUCOTROL Take 10 mg by mouth daily before breakfast.   lisinopril 5 MG tablet Commonly known as:  PRINIVIL,ZESTRIL Take 5 mg by mouth daily.   metFORMIN 1000 MG tablet Commonly known as:  GLUCOPHAGE Take 1,000 mg by mouth 2 (two) times daily  with a meal.   potassium chloride SA 20 MEQ tablet Commonly known as:  K-DUR,KLOR-CON Take by mouth.   simvastatin 20 MG tablet Commonly known as:  ZOCOR Take 20 mg by mouth daily.       Allergies: No Known Allergies  Family History: Family History  Problem Relation Age of Onset  . Kidney disease Neg Hx   . Prostate cancer Neg Hx     Social History:  reports that he has quit smoking. He has never used smokeless tobacco. He reports that he does not drink alcohol or use drugs.  ROS: UROLOGY Frequent Urination?: No Hard to postpone urination?: No Burning/pain with urination?: No Get up at night to urinate?: Yes Leakage of urine?: No Urine stream starts and stops?: No Trouble starting stream?: No Do you have to strain to urinate?: No Blood in urine?: No Urinary tract infection?: No Sexually transmitted disease?: No Injury to kidneys or bladder?: No Painful intercourse?: No Weak stream?: No Erection problems?: No Penile pain?: No  Gastrointestinal Nausea?: No Vomiting?: No Indigestion/heartburn?: No Diarrhea?: No Constipation?: No  Constitutional Fever: No Night sweats?: No Weight loss?: No Fatigue?: No  Skin Skin rash/lesions?: No Itching?: No  Eyes  Blurred vision?: No Double vision?: No  Ears/Nose/Throat Sore throat?: No Sinus problems?: No  Hematologic/Lymphatic Swollen glands?: No Easy bruising?: No  Cardiovascular Leg swelling?: No Chest pain?: No  Respiratory Cough?: Yes Shortness of breath?: No  Endocrine Excessive thirst?: No  Musculoskeletal Back pain?: No Joint pain?: No  Neurological Headaches?: No Dizziness?: No  Psychologic Depression?: No Anxiety?: No  Physical Exam: BP (!) 161/82 (BP Location: Left Arm, Patient Position: Sitting, Cuff Size: Normal)   Pulse (!) 50   Ht 5\' 7"  (1.702 m)   Constitutional:  Alert, No acute distress. Respiratory: Normal respiratory effort, no increased work of breathing. GI:  Abdomen is soft, SPT in place but a large dried blood clot is seen in the tip, no plug in place, site is clean and dry Neurologic:  moving all 4 extremities. Psychiatric: Normal mood and affect.    Assessment & Plan:    1. Urinary retention:   - patient's SPT was found to be encrusted and rank in odor, I am concerned that it has not been changed monthly.    - will have exchanged the SPT today and have plugged the tube  - will have a voiding trial at this time - plug to remain in place unless the patient experiences suprapubic pain or does not void from penis  - will RTC in 2 weeks for PVR and possible removal of the SPT  Return in about 2 weeks (around 09/19/2016) for PVR.  These notes generated with voice recognition software. I apologize for typographical errors.  Michiel CowboySHANNON Gayla Benn, PA-C  [facility] [address], [address] [place], [place] [address] [phone]

## 2016-09-19 ENCOUNTER — Ambulatory Visit (INDEPENDENT_AMBULATORY_CARE_PROVIDER_SITE_OTHER): Payer: Medicare Other | Admitting: Urology

## 2016-09-19 ENCOUNTER — Ambulatory Visit: Payer: Medicare Other | Admitting: Urology

## 2016-09-19 ENCOUNTER — Encounter: Payer: Self-pay | Admitting: Urology

## 2016-09-19 VITALS — BP 149/64 | HR 58 | Ht 68.0 in | Wt 158.6 lb

## 2016-09-19 DIAGNOSIS — R339 Retention of urine, unspecified: Secondary | ICD-10-CM | POA: Diagnosis not present

## 2016-09-19 LAB — BLADDER SCAN AMB NON-IMAGING: SCAN RESULT: 0

## 2016-09-19 NOTE — Progress Notes (Signed)
2:41 PM   Zachary [name] [birth year] [id number]  Referring provider: Marisue Ivan, MD [phone] [facility] [address] [facility] [place], [name] [address]  Chief Complaint  Patient presents with  . Follow-up    2 week follow up Urinary Retention    HPI: 83 yo WM who presents today for a two week follow up for possible removal of his SPT tube.  Background  history Patient is an 83 year old WM who presents today with to discuss removal of the SPT tube.  Patient has a history of a bladder neck contracture and has a SPT in place to manage his urinary retention.  According to the facility, [facility], they are requesting possible removal of the SPT tube as he is peeing from his penis.  Patient is a poor historian.  He caregiver with him today has only worked with him for only one day.  I cannot get a history from either individual.   SPT was placed due to chronic urinary retention.    At his visit two weeks, his SPT was exchanged and plugged.  He returns today for a two week follow up.  When the patient presented, the SPT was connected to the leg bag.  When we contacted [facility], they stated that his SPT was leaking urine all over the place and no plug was seen.    PMH: Past Medical History:  Diagnosis Date  . Bladder neck contracture   . BPH (benign prostatic hyperplasia)   . Coronary disease   . DM (diabetes mellitus) (HCC)   . ED (erectile dysfunction)   . Elevated PSA   . Hematuria   . HTN (hypertension)   . Recurrent UTI   . Retention of urine     Surgical History: Past Surgical History:  Procedure Laterality Date  . CARDIAC SURGERY    . CERVICAL DISC SURGERY    . ktp laser of prostate    . PROSTATE BIOPSY      Home Medications:  Allergies as of 09/19/2016   No Known Allergies     Medication List       Accurate as of 09/19/16  2:41 PM. Always use your most recent med list.          donepezil 10 MG tablet Commonly known as:  ARICEPT Take 10  mg by mouth at bedtime.   fluorouracil 5 % cream Commonly known as:  EFUDEX Reported on 10/20/2015   furosemide 20 MG tablet Commonly known as:  LASIX Take 20 mg by mouth.   glipiZIDE 10 MG tablet Commonly known as:  GLUCOTROL Take 10 mg by mouth daily before breakfast.   lisinopril 5 MG tablet Commonly known as:  PRINIVIL,ZESTRIL Take 5 mg by mouth daily.   metFORMIN 1000 MG tablet Commonly known as:  GLUCOPHAGE Take 1,000 mg by mouth 2 (two) times daily with a meal.   potassium chloride SA 20 MEQ tablet Commonly known as:  K-DUR,KLOR-CON Take by mouth.   simvastatin 20 MG tablet Commonly known as:  ZOCOR Take 20 mg by mouth daily.       Allergies: No Known Allergies  Family History: Family History  Problem Relation Age of Onset  . Kidney disease Neg Hx   . Prostate cancer Neg Hx     Social History:  reports that he has quit smoking. He has never used smokeless tobacco. He reports that he does not drink alcohol or use drugs.  ROS: UROLOGY Frequent Urination?: No Hard to postpone  urination?: No Burning/pain with urination?: No Get up at night to urinate?: No Leakage of urine?: No Urine stream starts and stops?: No Trouble starting stream?: No Do you have to strain to urinate?: No Blood in urine?: No Urinary tract infection?: No Sexually transmitted disease?: No Injury to kidneys or bladder?: No Painful intercourse?: No Weak stream?: No Erection problems?: No Penile pain?: No  Gastrointestinal Nausea?: No Vomiting?: No Indigestion/heartburn?: No Diarrhea?: No Constipation?: Yes  Constitutional Fever: No Night sweats?: No Weight loss?: No Fatigue?: No  Skin Skin rash/lesions?: No Itching?: No  Eyes Blurred vision?: No Double vision?: No  Ears/Nose/Throat Sore throat?: No Sinus problems?: No  Hematologic/Lymphatic Swollen glands?: No Easy bruising?: No  Cardiovascular Leg swelling?: No Chest pain?: No  Respiratory Cough?:  No Shortness of breath?: No  Endocrine Excessive thirst?: No  Musculoskeletal Back pain?: No Joint pain?: No  Neurological Headaches?: No Dizziness?: No  Psychologic Depression?: No Anxiety?: No  Physical Exam: BP (!) 149/64   Pulse (!) 58   Ht 5\' 8"  (1.727 m)   Wt 158 lb 9.6 oz (71.9 kg)   BMI 24.12 kg/m   Constitutional:  Alert, No acute distress. Respiratory: Normal respiratory effort, no increased work of breathing. GI: Abdomen is soft, SPT in place, site is clean and dry Neurologic:  moving all 4 extremities. Psychiatric: Normal mood and affect.    Assessment & Plan:    1. Urinary retention:   - we will tape the plug to the catheter   - will have a voiding trial at this time - plug to remain in place unless the patient experiences suprapubic pain or does not void from penis  - will RTC in 1 weeks for PVR and possible removal of the SPT  Return in about 1 week (around 09/26/2016) for possible removal of SPT.  These notes generated with voice recognition software. I apologize for typographical errors.  Michiel CowboySHANNON Mordecai Tindol, PA-C  [facility] [address], [address] [place], [place] [address] [phone]

## 2016-09-26 NOTE — Progress Notes (Signed)
2:20 PM   Zachary [name] 09/04/1926 [id number]  Referring provider: Marisue Ivan, MD [phone] [facility] [address] [facility] [place], [name] [address]  Chief Complaint  Patient presents with  . Urinary Retention    HPI: 83 yo WM who presents today for a two week follow up for possible removal of his SPT tube.  Background  history Patient is an 83 year old WM who presents today with to discuss removal of the SPT tube.  Patient has a history of a bladder neck contracture and has a SPT in place to manage his urinary retention.  According to the facility, [facility], they are requesting possible removal of the SPT tube as he is peeing from his penis.  Patient is a poor historian.  He caregiver with him today has only worked with him for only one day.  I cannot get a history from either individual.   SPT was placed due to chronic urinary retention.    At his visit four weeks, his SPT was exchanged and plugged.  He returned for a two week follow up.  When the patient presented, the SPT was connected to the leg bag.  When we contacted [facility], they stated that his SPT was leaking urine all over the place and no plug was seen.    A plug was placed into the SPT to see if he could pass a voiding trial for possible removal of his SPT tube.  His plug is till in place.  His PVR was 37 mL.  Patient is without complaint.    PMH: Past Medical History:  Diagnosis Date  . Bladder neck contracture   . BPH (benign prostatic hyperplasia)   . Coronary disease   . DM (diabetes mellitus) (HCC)   . ED (erectile dysfunction)   . Elevated PSA   . Hematuria   . HTN (hypertension)   . Recurrent UTI   . Retention of urine     Surgical History: Past Surgical History:  Procedure Laterality Date  . CARDIAC SURGERY    . CERVICAL DISC SURGERY    . ktp laser of prostate    . PROSTATE BIOPSY      Home Medications:  Allergies as of 09/27/2016   No Known Allergies     Medication  List       Accurate as of 09/27/16  2:20 PM. Always use your most recent med list.          donepezil 10 MG tablet Commonly known as:  ARICEPT Take 10 mg by mouth at bedtime.   fluorouracil 5 % cream Commonly known as:  EFUDEX Reported on 10/20/2015   furosemide 20 MG tablet Commonly known as:  LASIX Take 20 mg by mouth.   glipiZIDE 10 MG tablet Commonly known as:  GLUCOTROL Take 10 mg by mouth daily before breakfast.   lisinopril 5 MG tablet Commonly known as:  PRINIVIL,ZESTRIL Take 5 mg by mouth daily.   metFORMIN 1000 MG tablet Commonly known as:  GLUCOPHAGE Take 1,000 mg by mouth 2 (two) times daily with a meal.   potassium chloride SA 20 MEQ tablet Commonly known as:  K-DUR,KLOR-CON Take by mouth.   simvastatin 20 MG tablet Commonly known as:  ZOCOR Take 20 mg by mouth daily.       Allergies: No Known Allergies  Family History: Family History  Problem Relation Age of Onset  . Kidney disease Neg Hx   . Prostate cancer Neg Hx  Social History:  reports that he has quit smoking. He has never used smokeless tobacco. He reports that he does not drink alcohol or use drugs.  ROS: UROLOGY Frequent Urination?: Yes Hard to postpone urination?: No Burning/pain with urination?: No Get up at night to urinate?: Yes Leakage of urine?: Yes Urine stream starts and stops?: No Trouble starting stream?: No Do you have to strain to urinate?: No Blood in urine?: No Urinary tract infection?: No Sexually transmitted disease?: No Injury to kidneys or bladder?: No Painful intercourse?: No Weak stream?: No Erection problems?: No Penile pain?: No  Gastrointestinal Nausea?: No Vomiting?: No Indigestion/heartburn?: No Diarrhea?: No Constipation?: No  Constitutional Fever: No Night sweats?: No Weight loss?: No Fatigue?: No  Skin Skin rash/lesions?: No Itching?: No  Eyes Blurred vision?: No Double vision?: No  Ears/Nose/Throat Sore throat?: No Sinus  problems?: No  Hematologic/Lymphatic Swollen glands?: No Easy bruising?: No  Cardiovascular Leg swelling?: No Chest pain?: No  Respiratory Cough?: No Shortness of breath?: No  Endocrine Excessive thirst?: No  Musculoskeletal Back pain?: No Joint pain?: No  Neurological Headaches?: No Dizziness?: No  Psychologic Depression?: No Anxiety?: No  Physical Exam: BP (!) 146/73 (BP Location: Right Arm, Patient Position: Sitting, Cuff Size: Normal)   Pulse 63   Constitutional: Well nourished. Alert and oriented, No acute distress. HEENT: [place] AT, moist mucus membranes. Trachea midline, no masses. Cardiovascular: No clubbing, cyanosis, or edema. Respiratory: Normal respiratory effort, no increased work of breathing. GI: Abdomen is soft, non tender, non distended, no abdominal masses. Liver and spleen not palpable.  No hernias appreciated.  Stool sample for occult testing is not indicated.   GU: No CVA tenderness.  No bladder fullness or masses.  Patient with uncircumcised phallus.  Foreskin easily retracted  Urethral meatus is patent.  No penile discharge. No penile lesions or rashes. Scrotum without lesions, cysts, rashes and/or edema.  Testicles are located scrotally bilaterally. No masses are appreciated in the testicles. Right inguinal bulge, non tender and non reducible.  Left and right epididymis are normal. Rectal: Not performed.   Skin: No rashes, bruises or suspicious lesions. Lymph: No cervical or inguinal adenopathy. Neurologic: Grossly intact, no focal deficits, moving all 4 extremities. Psychiatric: Normal mood and affect.   Catheter Removal Patient is present today for a catheter removal.  10 ml of water was drained from the balloon. A 16 FR foley cath was removed from the bladder no complications were noted . Patient tolerated well.  Preformed by: Michiel CowboyShannon Fenris Cauble PA-C  Assessment & Plan:    1. Urinary retention:   - SPT is removed  - Patient to RTC in one  month for PVR  - Advised to seek treatment in the ED if cannot void  - will RTC in 1 weeks for PVR and possible removal of the SPT  2. Right inguinal bulge  - schedule scrotal ultrasound  Return in about 1 month (around 10/28/2016) for PVR.  These notes generated with voice recognition software. I apologize for typographical errors.  Michiel CowboySHANNON Kane Kusek, PA-C  [facility] [address], [address] [place], [place] [address] [phone]

## 2016-09-27 ENCOUNTER — Encounter: Payer: Self-pay | Admitting: Urology

## 2016-09-27 ENCOUNTER — Ambulatory Visit (INDEPENDENT_AMBULATORY_CARE_PROVIDER_SITE_OTHER): Payer: Medicare Other | Admitting: Urology

## 2016-09-27 VITALS — BP 146/73 | HR 63

## 2016-09-27 DIAGNOSIS — R339 Retention of urine, unspecified: Secondary | ICD-10-CM

## 2016-09-27 DIAGNOSIS — N5089 Other specified disorders of the male genital organs: Secondary | ICD-10-CM

## 2016-09-27 LAB — BLADDER SCAN AMB NON-IMAGING: SCAN RESULT: 37

## 2016-10-26 NOTE — Progress Notes (Deleted)
10:07 AM   ALONDRA SAHNI Feb 20, 1927 [id number]  Referring provider: Marisue Ivan, MD [phone] [facility] [address] [facility] [place], [name] [address]  No chief complaint on file.   HPI: 83 yo WM who presents today for a one month follow up after removal of his SPT.    Background  history Patient is an 83 year old WM who presents today with to discuss removal of the SPT tube.  Patient has a history of a bladder neck contracture and has a SPT in place to manage his urinary retention.  According to the facility, [facility], they are requesting possible removal of the SPT tube as he is peeing from his penis.  Patient is a poor historian.  He caregiver with him today has only worked with him for only one day.  I cannot get a history from either individual.   SPT was placed due to chronic urinary retention.    At his visit four weeks, his SPT was exchanged and plugged.  He returned for a two week follow up.  When the patient presented, the SPT was connected to the leg bag.  When we contacted [facility], they stated that his SPT was leaking urine all over the place and no plug was seen.   A plug was placed into the SPT to see if he could pass a voiding trial for possible removal of his SPT tube.  His plug is till in place.  His PVR was 37 mL.  Patient is without complaint.  SPT was removed one month ago.  Today, ***.  PMH: Past Medical History:  Diagnosis Date  . Bladder neck contracture   . BPH (benign prostatic hyperplasia)   . Coronary disease   . DM (diabetes mellitus) (HCC)   . ED (erectile dysfunction)   . Elevated PSA   . Hematuria   . HTN (hypertension)   . Recurrent UTI   . Retention of urine     Surgical History: Past Surgical History:  Procedure Laterality Date  . CARDIAC SURGERY    . CERVICAL DISC SURGERY    . ktp laser of prostate    . PROSTATE BIOPSY      Home Medications:  Allergies as of 10/27/2016   No Known Allergies     Medication  List       Accurate as of 10/26/16 10:07 AM. Always use your most recent med list.          donepezil 10 MG tablet Commonly known as:  ARICEPT Take 10 mg by mouth at bedtime.   fluorouracil 5 % cream Commonly known as:  EFUDEX Reported on 10/20/2015   furosemide 20 MG tablet Commonly known as:  LASIX Take 20 mg by mouth.   glipiZIDE 10 MG tablet Commonly known as:  GLUCOTROL Take 10 mg by mouth daily before breakfast.   lisinopril 5 MG tablet Commonly known as:  PRINIVIL,ZESTRIL Take 5 mg by mouth daily.   metFORMIN 1000 MG tablet Commonly known as:  GLUCOPHAGE Take 1,000 mg by mouth 2 (two) times daily with a meal.   potassium chloride SA 20 MEQ tablet Commonly known as:  K-DUR,KLOR-CON Take by mouth.   simvastatin 20 MG tablet Commonly known as:  ZOCOR Take 20 mg by mouth daily.       Allergies: No Known Allergies  Family History: Family History  Problem Relation Age of Onset  . Kidney disease Neg Hx   . Prostate cancer Neg Hx  Social History:  reports that he has quit smoking. He has never used smokeless tobacco. He reports that he does not drink alcohol or use drugs.  ROS:                                        Physical Exam: There were no vitals taken for this visit.  Constitutional: Well nourished. Alert and oriented, No acute distress. HEENT: [place] AT, moist mucus membranes. Trachea midline, no masses. Cardiovascular: No clubbing, cyanosis, or edema. Respiratory: Normal respiratory effort, no increased work of breathing. GI: Abdomen is soft, non tender, non distended, no abdominal masses. Liver and spleen not palpable.  No hernias appreciated.  Stool sample for occult testing is not indicated.   GU: No CVA tenderness.  No bladder fullness or masses.  Patient with uncircumcised phallus.  Foreskin easily retracted  Urethral meatus is patent.  No penile discharge. No penile lesions or rashes. Scrotum without lesions, cysts,  rashes and/or edema.  Testicles are located scrotally bilaterally. No masses are appreciated in the testicles. Right inguinal bulge, non tender and non reducible.  Left and right epididymis are normal. Rectal: Not performed.   Skin: No rashes, bruises or suspicious lesions. Lymph: No cervical or inguinal adenopathy. Neurologic: Grossly intact, no focal deficits, moving all 4 extremities. Psychiatric: Normal mood and affect.   Assessment & Plan:    1. Urinary retention:   - SPT is removed  - Patient to RTC in one month for PVR  - Advised to seek treatment in the ED if cannot void  - will RTC in 1 weeks for PVR and possible removal of the SPT  2. Right inguinal bulge  - schedule scrotal ultrasound  No Follow-up on file.  These notes generated with voice recognition software. I apologize for typographical errors.  Michiel Cowboy, PA-C  [facility] [address], [address] [place], [name] [address] [phone]

## 2016-10-27 ENCOUNTER — Ambulatory Visit: Payer: Medicare Other | Admitting: Urology

## 2016-10-27 ENCOUNTER — Encounter: Payer: Self-pay | Admitting: Urology

## 2016-11-23 NOTE — Progress Notes (Signed)
10:21 AM   Zachary [name] May 24, 1927 [id number]  Referring provider: Marisue IvanLinthavong, Kanh[place], MD [phone] [facility] [address] [facility] [place], [place] [id number]  Chief Complaint  Patient presents with  . Urinary Retention    1 month follow up     HPI: 83 yo WM who presents today for a one month follow up after removal of his SPT with caregiver, Diane.    Background  history Patient is an 83 year old WM who presents today with to discuss removal of the SPT tube.  Patient has a history of a bladder neck contracture and has a SPT in place to manage his urinary retention.  [facility], they are requesting possible removal of the SPT tube as he is peeing from his penis.  Patient is a poor historian.  He caregiver with him today has only worked with him for only one day.  I cannot get a history from either individual.   SPT was placed due to chronic urinary retention.    At his visit four weeks, his SPT was exchanged and plugged.  He returned for a two week follow up.  When the patient presented, the SPT was connected to the leg bag.  [facility], they stated that his SPT was leaking urine all over the place and no plug was seen.   A plug was placed into the SPT to see if he could pass a voiding trial for possible removal of his SPT tube.  His plug is till in place.  His PVR was 37 mL.  Patient is without complaint.  SPT was removed one month ago.  Today, he is complaining of frequency, urgency, and occasional nocturia, incontinence, intermittency and hesitancy.  He is not have dysuria, gross hematuria or suprapubic pain.  He denies fevers, chills, nausea and vomiting.    He does admit to not drinking a lot of water.    PMH: Past Medical History:  Diagnosis Date  . Bladder neck contracture   . BPH (benign prostatic hyperplasia)   . Coronary disease   . DM (diabetes mellitus) (HCC)   . ED (erectile dysfunction)   . Elevated PSA   .  Hematuria   . HTN (hypertension)   . Recurrent UTI   . Retention of urine     Surgical History: Past Surgical History:  Procedure Laterality Date  . CARDIAC SURGERY    . CERVICAL DISC SURGERY    . ktp laser of prostate    . PROSTATE BIOPSY      Home Medications:  Allergies as of 11/24/2016   No Known Allergies     Medication List       Accurate as of 11/24/16 10:21 AM. Always use your most recent med list.          donepezil 10 MG tablet Commonly known as:  ARICEPT Take 10 mg by mouth at bedtime.   fluorouracil 5 % cream Commonly known as:  EFUDEX Reported on 10/20/2015   furosemide 20 MG tablet Commonly known as:  LASIX Take 20 mg by mouth.   glipiZIDE 10 MG tablet Commonly known as:  GLUCOTROL Take 10 mg by mouth daily before breakfast.   lisinopril 5 MG tablet Commonly known as:  PRINIVIL,ZESTRIL Take 5 mg by mouth daily.   metFORMIN 1000 MG tablet Commonly known as:  GLUCOPHAGE Take 1,000 mg by mouth 2 (two) times daily with a meal.   potassium chloride SA 20 MEQ tablet  Commonly known as:  K-DUR,KLOR-CON Take by mouth.   simvastatin 20 MG tablet Commonly known as:  ZOCOR Take 20 mg by mouth daily.       Allergies: No Known Allergies  Family History: Family History  Problem Relation Age of Onset  . Kidney disease Neg Hx   . Prostate cancer Neg Hx     Social History:  reports that he has quit smoking. He has never used smokeless tobacco. He reports that he does not drink alcohol or use drugs.  ROS: UROLOGY Frequent Urination?: Yes Hard to postpone urination?: Yes Burning/pain with urination?: No Get up at night to urinate?: Yes Leakage of urine?: Yes Urine stream starts and stops?: Yes Trouble starting stream?: Yes Do you have to strain to urinate?: No Blood in urine?: No Urinary tract infection?: No Sexually transmitted disease?: No Injury to kidneys or bladder?: No Painful intercourse?: No Weak stream?: No Erection problems?:  No Penile pain?: No  Gastrointestinal Nausea?: No Vomiting?: No Indigestion/heartburn?: No Diarrhea?: Yes Constipation?: Yes  Constitutional Fever: No Night sweats?: No Weight loss?: No Fatigue?: Yes  Skin Skin rash/lesions?: No Itching?: No  Eyes Blurred vision?: No Double vision?: No  Ears/Nose/Throat Sore throat?: No Sinus problems?: No  Hematologic/Lymphatic Swollen glands?: No Easy bruising?: Yes  Cardiovascular Leg swelling?: No Chest pain?: No  Respiratory Cough?: Yes Shortness of breath?: No  Endocrine Excessive thirst?: No  Musculoskeletal Back pain?: No Joint pain?: No  Neurological Headaches?: No Dizziness?: No  Psychologic Depression?: Yes Anxiety?: Yes  Physical Exam: BP (!) 157/74   Pulse 65   Ht 5\' 6"  (1.676 m)   Wt 163 lb 4.8 oz (74.1 kg)   BMI 26.36 kg/m   Constitutional: Well nourished. Alert and oriented, No acute distress. HEENT: [place] AT, moist mucus membranes. Trachea midline, no masses. Cardiovascular: No clubbing, cyanosis, or edema. Respiratory: Normal respiratory effort, no increased work of breathing. Skin: No rashes, bruises or suspicious lesions. Lymph: No cervical or inguinal adenopathy. Neurologic: Grossly intact, no focal deficits, moving all 4 extremities. Psychiatric: Normal mood and affect.   Assessment & Plan:    1. History of retention  - SPT is removed  - PVR is niL  - Advised to seek treatment in the ED if cannot void     Return if symptoms worsen or fail to improve.  These notes generated with voice recognition software. I apologize for typographical errors.  Michiel Cowboy, PA-C  [facility] [address] [place], [name] [address] [phone]

## 2016-11-24 ENCOUNTER — Encounter: Payer: Self-pay | Admitting: Urology

## 2016-11-24 ENCOUNTER — Ambulatory Visit (INDEPENDENT_AMBULATORY_CARE_PROVIDER_SITE_OTHER): Payer: Medicare Other | Admitting: Urology

## 2016-11-24 VITALS — BP 157/74 | HR 65 | Ht 66.0 in | Wt 163.3 lb

## 2016-11-24 DIAGNOSIS — R339 Retention of urine, unspecified: Secondary | ICD-10-CM | POA: Diagnosis not present

## 2016-11-24 DIAGNOSIS — Z87898 Personal history of other specified conditions: Secondary | ICD-10-CM

## 2016-11-24 LAB — BLADDER SCAN AMB NON-IMAGING: SCAN RESULT: 0

## 2018-04-02 ENCOUNTER — Ambulatory Visit (INDEPENDENT_AMBULATORY_CARE_PROVIDER_SITE_OTHER): Payer: Medicare Other | Admitting: Urology

## 2018-04-02 ENCOUNTER — Encounter: Payer: Self-pay | Admitting: Urology

## 2018-04-02 VITALS — BP 129/79 | HR 56

## 2018-04-02 DIAGNOSIS — N5089 Other specified disorders of the male genital organs: Secondary | ICD-10-CM | POA: Diagnosis not present

## 2018-04-02 DIAGNOSIS — Z87898 Personal history of other specified conditions: Secondary | ICD-10-CM | POA: Diagnosis not present

## 2018-04-02 LAB — BLADDER SCAN AMB NON-IMAGING

## 2018-04-02 NOTE — Progress Notes (Signed)
10:31 AM   Zachary [name] [birth year] [id number]  Referring provider: Marisue [name], [place], MD [phone] [facility] [address] [facility] [place], [place] [id number]  Chief Complaint  Patient presents with  . Urinary Retention    HPI: 84 yo WM with a history of bladder neck contracture and scrotal swelling who presents today for a follow up.  History of bladder neck contracture PVR is 158 mL.    Scrotal swelling Patient has had right scrotal swelling for several years.  He states it is not painful.  He denies any injury to the scrotal area.    Patient denies any gross hematuria, dysuria or suprapubic/flank pain.  Patient denies any fevers, chills, nausea or vomiting.      PMH: Past Medical History:  Diagnosis Date  . Bladder neck contracture   . BPH (benign prostatic hyperplasia)   . Coronary disease   . DM (diabetes mellitus) (HCC)   . ED (erectile dysfunction)   . Elevated PSA   . Hematuria   . HTN (hypertension)   . Recurrent UTI   . Retention of urine     Surgical History: Past Surgical History:  Procedure Laterality Date  . CARDIAC SURGERY    . CERVICAL DISC SURGERY    . ktp laser of prostate    . PROSTATE BIOPSY      Home Medications:  Allergies as of 04/02/2018   No Known Allergies     Medication List        Accurate as of 04/02/18 10:31 AM. Always use your most recent med list.          donepezil 10 MG tablet Commonly known as:  ARICEPT Take 10 mg by mouth at bedtime.   escitalopram 5 MG tablet Commonly known as:  LEXAPRO   fluorouracil 5 % cream Commonly known as:  EFUDEX Reported on 10/20/2015   furosemide 20 MG tablet Commonly known as:  LASIX Take 20 mg by mouth.   glipiZIDE 10 MG tablet Commonly known as:  GLUCOTROL Take 10 mg by mouth daily before breakfast.   GLUCOCOM BLOOD GLUCOSE MONITOR Devi 1 each.   imiquimod 5 % cream Commonly known as:  ALDARA Apply to site on nose daily, 5 times a week for 6 weeks   LANTUS 100  UNIT/ML injection Generic drug:  insulin glargine   lisinopril 5 MG tablet Commonly known as:  PRINIVIL,ZESTRIL Take 5 mg by mouth daily.   metFORMIN 1000 MG tablet Commonly known as:  GLUCOPHAGE Take 1,000 mg by mouth 2 (two) times daily with a meal.   potassium chloride SA 20 MEQ tablet Commonly known as:  K-DUR,KLOR-CON Take by mouth.   simvastatin 20 MG tablet Commonly known as:  ZOCOR Take 20 mg by mouth daily.       Allergies: No Known Allergies  Family History: Family History  Problem Relation Age of Onset  . Kidney disease Neg Hx   . Prostate cancer Neg Hx     Social History:  reports that he has quit smoking. He has never used smokeless tobacco. He reports that he does not drink alcohol or use drugs.  ROS: UROLOGY Frequent Urination?: No Hard to postpone urination?: No Burning/pain with urination?: No Get up at night to urinate?: No Leakage of urine?: No Urine stream starts and stops?: No Trouble starting stream?: No Do you have to strain to urinate?: No Blood in urine?: No Urinary tract infection?: No Sexually transmitted disease?: No Injury to kidneys or bladder?: No Painful intercourse?:  No Weak stream?: No Erection problems?: No Penile pain?: No  Gastrointestinal Nausea?: No Vomiting?: No Indigestion/heartburn?: No Diarrhea?: No Constipation?: Yes  Constitutional Fever: No Night sweats?: No Weight loss?: No Fatigue?: No  Skin Skin rash/lesions?: No Itching?: No  Eyes Blurred vision?: No Double vision?: No  Ears/Nose/Throat Sore throat?: No Sinus problems?: No  Hematologic/Lymphatic Swollen glands?: No Easy bruising?: No  Cardiovascular Leg swelling?: No Chest pain?: No  Respiratory Cough?: No Shortness of breath?: No  Endocrine Excessive thirst?: No  Musculoskeletal Back pain?: No Joint pain?: No  Neurological Headaches?: No Dizziness?: No  Psychologic Depression?: No Anxiety?: No  Physical Exam: BP  129/79 (BP Location: Left Arm, Patient Position: Sitting, Cuff Size: Normal)   Pulse (!) 56   Constitutional: Well nourished. Alert and oriented, No acute distress. HEENT: [place] AT, moist mucus membranes. Trachea midline, no masses.  Left eye is red.   Cardiovascular: No clubbing, cyanosis, or edema. Respiratory: Normal respiratory effort, no increased work of breathing. GI: Abdomen is soft, non tender, non distended, no abdominal masses. Liver and spleen not palpable.  No hernias appreciated.  Stool sample for occult testing is not indicated.   GU: No CVA tenderness.  No bladder fullness or masses.  Patient with uncircumcised phallus. Foreskin easily retracted  Urethral meatus is patent.  No penile discharge. No penile lesions or rashes. Scrotum without lesions, cysts, rashes and/or edema.  Left testicles is located scrotally.  No masses are appreciated in the testicle.  Large right hydrocele.  Left epididymis is normal. Rectal: Not performed.   Skin: Several skin lesions on scalp.   Lymph: No cervical or inguinal adenopathy. Neurologic: Grossly intact, no focal deficits, moving all 4 extremities. Psychiatric: Normal mood and affect.  Pertinent Imaging Results for Zachary [name] (MRN [id number]) as of 04/02/2018 10:19  Ref. Range 04/02/2018 10:08  Scan Result Unknown   Assessment & Plan:    1. History of retention PVR is 158 mL  2. Scrotal swelling We will obtain a scrotal ultrasound for further evaluation of contents within the right hemi-scrotum as they are not palpable Patient will return for scrotal ultrasound report   Return for scrotal ultrasound report .  These notes generated with voice recognition software. I apologize for typographical errors.  Michiel Cowboy, PA-C  [facility] [address] [place], [name] [address] [phone]

## 2018-04-30 ENCOUNTER — Ambulatory Visit
Admission: RE | Admit: 2018-04-30 | Discharge: 2018-04-30 | Disposition: A | Discharge status: Home / Self Care | Payer: Medicare Other | Source: Ambulatory Visit | Attending: Urology | Admitting: Urology

## 2018-04-30 DIAGNOSIS — N5089 Other specified disorders of the male genital organs: Secondary | ICD-10-CM

## 2018-04-30 DIAGNOSIS — N433 Hydrocele, unspecified: Secondary | ICD-10-CM | POA: Insufficient documentation

## 2018-04-30 DIAGNOSIS — I861 Scrotal varices: Secondary | ICD-10-CM | POA: Diagnosis not present

## 2018-04-30 DIAGNOSIS — K4090 Unilateral inguinal hernia, without obstruction or gangrene, not specified as recurrent: Secondary | ICD-10-CM | POA: Diagnosis not present

## 2018-05-03 ENCOUNTER — Ambulatory Visit (INDEPENDENT_AMBULATORY_CARE_PROVIDER_SITE_OTHER): Payer: Medicare Other | Admitting: Urology

## 2018-05-03 ENCOUNTER — Encounter: Payer: Self-pay | Admitting: Urology

## 2018-05-03 VITALS — BP 157/78 | HR 48 | Ht 67.0 in | Wt 174.0 lb

## 2018-05-03 DIAGNOSIS — Z87898 Personal history of other specified conditions: Secondary | ICD-10-CM

## 2018-05-03 DIAGNOSIS — N5089 Other specified disorders of the male genital organs: Secondary | ICD-10-CM

## 2018-05-03 NOTE — Progress Notes (Signed)
10:15 PM   Zachary [name] 1927-03-27 [id number]  Referring provider: Marisue Ivan, MD [phone] [facility] [address] [facility] [place], [name] [address]  Chief Complaint  Patient presents with  . Follow-up    HPI: 84 yo WM with a history of bladder neck contracture and scrotal swelling who presents today to review scrotal ultrasound results with caregiver, Bonita Quin.     History of bladder neck contracture PVR 158 mL.    Scrotal swelling Patient has had right scrotal swelling for several years.  He states it is not painful.  He denies any injury to the scrotal area.  Patient denies any gross hematuria, dysuria or suprapubic/flank pain.  Patient denies any fevers, chills, nausea or vomiting.   Scrotal ultrasound on 04/30/2018 revealed large right inguinal hernia extending into the right aspect of the scrotum which appears responsible for the scrotal enlargement.  Prominent varicoceles bilaterally with small right-sided hydrocele.  Normal appearance of the testes and epididymal structures with normal vascularity. No findings suspicious for malignancy  PMH: Past Medical History:  Diagnosis Date  . Bladder neck contracture   . BPH (benign prostatic hyperplasia)   . Coronary disease   . DM (diabetes mellitus) (HCC)   . ED (erectile dysfunction)   . Elevated PSA   . Hematuria   . HTN (hypertension)   . Recurrent UTI   . Retention of urine     Surgical History: Past Surgical History:  Procedure Laterality Date  . CARDIAC SURGERY    . CERVICAL DISC SURGERY    . ktp laser of prostate    . PROSTATE BIOPSY      Home Medications:  Allergies as of 05/03/2018   No Known Allergies     Medication List        Accurate as of 05/03/18 11:59 PM. Always use your most recent med list.          acetaminophen 325 MG tablet Commonly known as:  TYLENOL Take 650 mg by mouth every 6 (six) hours as needed.   cholecalciferol 1000 units tablet Commonly known as:  VITAMIN  D Take 1,000 Units by mouth daily.   donepezil 10 MG tablet Commonly known as:  ARICEPT Take 10 mg by mouth at bedtime.   escitalopram 5 MG tablet Commonly known as:  LEXAPRO   fenofibrate 145 MG tablet Commonly known as:  TRICOR Take 145 mg by mouth daily.   fluorouracil 5 % cream Commonly known as:  EFUDEX Reported on 10/20/2015   furosemide 20 MG tablet Commonly known as:  LASIX Take 20 mg by mouth.   glipiZIDE 10 MG tablet Commonly known as:  GLUCOTROL Take 10 mg by mouth daily before breakfast.   GLUCOCOM BLOOD GLUCOSE MONITOR Devi 1 each.   imiquimod 5 % cream Commonly known as:  ALDARA Apply to site on nose daily, 5 times a week for 6 weeks   ketotifen 0.025 % ophthalmic solution Commonly known as:  ZADITOR 1 drop 2 (two) times daily.   LANTUS 100 UNIT/ML injection Generic drug:  insulin glargine   lisinopril 5 MG tablet Commonly known as:  PRINIVIL,ZESTRIL Take 5 mg by mouth daily.   metFORMIN 1000 MG tablet Commonly known as:  GLUCOPHAGE Take 1,000 mg by mouth 2 (two) times daily with a meal.   polyethylene glycol packet Commonly known as:  MIRALAX / GLYCOLAX Take 17 g by mouth daily.   potassium chloride SA 20 MEQ tablet Commonly known as:  K-DUR,KLOR-CON Take by mouth.  simvastatin 20 MG tablet Commonly known as:  ZOCOR Take 20 mg by mouth daily.       Allergies: No Known Allergies  Family History: Family History  Problem Relation Age of Onset  . Kidney disease Neg Hx   . Prostate cancer Neg Hx     Social History:  reports that he has quit smoking. He has never used smokeless tobacco. He reports that he does not drink alcohol or use drugs.  ROS: UROLOGY Frequent Urination?: No Hard to postpone urination?: No Burning/pain with urination?: No Get up at night to urinate?: No Leakage of urine?: No Urine stream starts and stops?: No Trouble starting stream?: No Do you have to strain to urinate?: No Blood in urine?: No Urinary  tract infection?: No Sexually transmitted disease?: No Injury to kidneys or bladder?: No Painful intercourse?: No Weak stream?: No Erection problems?: No Penile pain?: No  Gastrointestinal Vomiting?: No Indigestion/heartburn?: No Diarrhea?: No Constipation?: No  Constitutional Fever: No Night sweats?: No Weight loss?: No Fatigue?: No  Skin Skin rash/lesions?: Yes Itching?: No  Eyes Blurred vision?: No Double vision?: No  Ears/Nose/Throat Sore throat?: No Sinus problems?: No  Hematologic/Lymphatic Swollen glands?: No Easy bruising?: No  Cardiovascular Leg swelling?: No Chest pain?: No  Respiratory Cough?: No Shortness of breath?: No  Endocrine Excessive thirst?: No  Musculoskeletal Back pain?: No Joint pain?: Yes  Neurological Headaches?: No Dizziness?: No  Psychologic Depression?: Yes Anxiety?: Yes  Physical Exam: BP (!) 157/78 (Patient Position: Sitting, Cuff Size: Normal)   Pulse (!) 48   Ht 5\' 7"  (1.702 m)   Wt 174 lb (78.9 kg)   BMI 27.25 kg/m   Constitutional: Well nourished. Alert and oriented, No acute distress. HEENT: [place] AT, moist mucus membranes. Trachea midline, no masses. Cardiovascular: No clubbing, cyanosis, or edema. Respiratory: Normal respiratory effort, no increased work of breathing. Skin: No rashes, bruises or suspicious lesions. Lymph: No cervical or inguinal adenopathy. Neurologic: Grossly intact, no focal deficits, moving all 4 extremities. Psychiatric: Normal mood and affect.   Pertinent Imaging CLINICAL DATA:  Marked scrotal swelling for uncertain duration. Limited clinical history available.  EXAM: SCROTAL ULTRASOUND  DOPPLER ULTRASOUND OF THE TESTICLES  TECHNIQUE: Complete ultrasound examination of the testicles, epididymis, and other scrotal structures was performed. Color and spectral Doppler ultrasound were also utilized to evaluate blood flow to the testicles.  COMPARISON:   None.  FINDINGS: Right testicle  Measurements: 3.6 x 1.8 x 3.5 cm. No mass or microlithiasis visualized.  Left testicle  Measurements: 3.5 x 2.0 x 2.8 cm. No mass or microlithiasis visualized.  Right epididymis:  Normal in size and appearance.  Left epididymis:  Normal in size and appearance.  Hydrocele:  There is a small right-sided hydrocele.  Varicocele:  There are bilateral varicoceles.  Pulsed Doppler interrogation of both testes demonstrates normal low resistance arterial and venous waveforms bilaterally.  There is a large amount of peristalsing small bowel within the scrotum.  IMPRESSION: Large right inguinal hernia extending into the right aspect of the scrotum which appears responsible for the scrotal enlargement. Prominent varicoceles bilaterally with small right-sided hydrocele. Normal appearance of the testes and epididymal structures with normal vascularity. No findings suspicious for malignancy.   Electronically Signed   By: David  [place] M.D.   On: 04/30/2018 14:29  I have independently reviewed the films and a large right hernia is appreciated.   Assessment & Plan:    1. History of retention PVR 158 mL  2. Scrotal swelling Due  to a large right inguinal hernia extending into the right aspect of the scrotum Explained to patient and caregiver that it will need to be managed conservatively as he is a poor surgical candidate Patient is advised that if they should start to experience pain that is not able to be controlled with pain medication, intractable nausea and/or vomiting and/or fevers greater than 103 or shaking chills to seek treatment in the emergency department for emergent intervention.     Return if symptoms worsen or fail to improve.  These notes generated with voice recognition software. I apologize for typographical errors.  Michiel Cowboy, PA-C  [facility] [address] [place], [name] [address] [phone]

## 2018-06-05 ENCOUNTER — Ambulatory Visit: Payer: Self-pay | Admitting: General Surgery

## 2018-06-12 ENCOUNTER — Ambulatory Visit (INDEPENDENT_AMBULATORY_CARE_PROVIDER_SITE_OTHER): Payer: Medicare Other | Admitting: General Surgery

## 2018-06-12 ENCOUNTER — Encounter: Payer: Self-pay | Admitting: General Surgery

## 2018-06-12 ENCOUNTER — Other Ambulatory Visit: Payer: Self-pay

## 2018-06-12 ENCOUNTER — Ambulatory Visit: Payer: Self-pay | Admitting: General Surgery

## 2018-06-12 VITALS — BP 185/70 | HR 49 | Temp 96.3°F | Resp 20 | Ht 67.0 in | Wt 179.0 lb

## 2018-06-12 DIAGNOSIS — K4090 Unilateral inguinal hernia, without obstruction or gangrene, not specified as recurrent: Secondary | ICD-10-CM

## 2018-06-12 NOTE — Patient Instructions (Addendum)
The patient is aware to call back for any questions or new concerns.  Hernia, Adult A hernia is the bulging of an organ or tissue through a weak spot in the muscles of the abdomen (abdominal wall). Hernias develop most often near the navel or groin. There are many kinds of hernias. Common kinds include:  Femoral hernia. This kind of hernia develops under the groin in the upper thigh area.  Inguinal hernia. This kind of hernia develops in the groin or scrotum.  Umbilical hernia. This kind of hernia develops near the navel.  Hiatal hernia. This kind of hernia causes part of the stomach to be pushed up into the chest.  Incisional hernia. This kind of hernia bulges through a scar from an abdominal surgery.  What are the causes? This condition may be caused by:  Heavy lifting.  Coughing over a long period of time.  Straining to have a bowel movement.  An incision made during an abdominal surgery.  A birth defect (congenital defect).  Excess weight or obesity.  Smoking.  Poor nutrition.  Cystic fibrosis.  Excess fluid in the abdomen.  Undescended testicles.  What are the signs or symptoms? Symptoms of a hernia include:  A lump on the abdomen. This is the first sign of a hernia. The lump may become more obvious with standing, straining, or coughing. It may get bigger over time if it is not treated or if the condition causing it is not treated.  Pain. A hernia is usually painless, but it may become painful over time if treatment is delayed. The pain is usually dull and may get worse with standing or lifting heavy objects.  Sometimes a hernia gets tightly squeezed in the weak spot (strangulated) or stuck there (incarcerated) and causes additional symptoms. These symptoms may include:  Vomiting.  Nausea.  Constipation.  Irritability.  How is this diagnosed? A hernia may be diagnosed with:  A physical exam. During the exam your health care provider may ask you to  cough or to make a specific movement, because a hernia is usually more visible when you move.  Imaging tests. These can include: ? X-rays. ? Ultrasound. ? CT scan.  How is this treated? A hernia that is small and painless may not need to be treated. A hernia that is large or painful may be treated with surgery. Inguinal hernias may be treated with surgery to prevent incarceration or strangulation. Strangulated hernias are always treated with surgery, because lack of blood to the trapped organ or tissue can cause it to die. Surgery to treat a hernia involves pushing the bulge back into place and repairing the weak part of the abdomen. Follow these instructions at home:  Avoid straining.  Do not lift anything heavier than 10 lb (4.5 kg).  Lift with your leg muscles, not your back muscles. This helps avoid strain.  When coughing, try to cough gently.  Prevent constipation. Constipation leads to straining with bowel movements, which can make a hernia worse or cause a hernia repair to break down. You can prevent constipation by: ? Eating a high-fiber diet that includes plenty of fruits and vegetables. ? Drinking enough fluids to keep your urine clear or pale yellow. Aim to drink 6-8 glasses of water per day. ? Using a stool softener as directed by your health care provider.  Lose weight, if you are overweight.  Do not use any tobacco products, including cigarettes, chewing tobacco, or electronic cigarettes. If you need help quitting, ask your   health care provider.  Keep all follow-up visits as directed by your health care provider. This is important. Your health care provider may need to monitor your condition. Contact a health care provider if:  You have swelling, redness, and pain in the affected area.  Your bowel habits change. Get help right away if:  You have a fever.  You have abdominal pain that is getting worse.  You feel nauseous or you vomit.  You cannot push the  hernia back in place by gently pressing on it while you are lying down.  The hernia: ? Changes in shape or size. ? Is stuck outside the abdomen. ? Becomes discolored. ? Feels hard or tender. This information is not intended to replace advice given to you by your health care provider. Make sure you discuss any questions you have with your health care provider. Document Released: 07/04/2005 Document Revised: 12/02/2015 Document Reviewed: 05/14/2014 Elsevier Interactive Patient Education  2017 Elsevier Inc.   

## 2018-06-12 NOTE — Progress Notes (Signed)
Patient ID: Zachary Dunningstchley A Mally, male   DOB: [birth year], 84 y.o.   MRN: [id number]  Chief Complaint  Patient presents with  . Hernia    HPI Zachary [name] is a 84 y.o. male.  Here for evaluation of a hernia referred by Dr Lovenia KimSlade-Hartman. He denies any pain. He saw Michiel CowboyShannon McGowan on 05-03-18 for scrotal swelling and large right inguinal hernia extending into the right aspect of the scrotum. He is here with his POA, Rigoberto Noellaine Barrow, daughter. She noticed the scrotal swelling at least 2 months ago. She states it has gotten larger. He also came [facility] [place]. He has been a resident there for 2 years. 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.   HPI  Past Medical History:  Diagnosis Date  . Bladder neck contracture   . BPH (benign prostatic hyperplasia)   . Coronary disease   . Dementia (HCC)   . Depressive disorder   . DM (diabetes mellitus) (HCC)   . ED (erectile dysfunction)   . Elevated PSA   . Hematuria   . History of suprapubic catheter   . HTN (hypertension)   . Polyneuropathy   . Recurrent UTI   . Retention of urine     Past Surgical History:  Procedure Laterality Date  . CARDIAC SURGERY    . CERVICAL DISC SURGERY    . ktp laser of prostate    . PROSTATE BIOPSY      Family History  Problem Relation Age of Onset  . Kidney disease Neg Hx   . Prostate cancer Neg Hx     Social History Social History   Tobacco Use  . Smoking status: Former Games developermoker  . Smokeless tobacco: Never Used  . Tobacco comment: quit 4 yrs  Substance Use Topics  . Alcohol use: No    Alcohol/week: 0.0 standard drinks  . Drug use: No    No Known Allergies  Current Outpatient Medications  Medication Sig Dispense Refill  . acetaminophen (TYLENOL) 325 MG tablet Take 650 mg by mouth every 6 (six) hours as needed.    . Blood Glucose Monitoring Suppl (GLUCOCOM BLOOD GLUCOSE MONITOR) DEVI 1 each.    . cholecalciferol (VITAMIN D) 1000  units tablet Take 1,000 Units by mouth daily.    Marland Kitchen. donepezil (ARICEPT) 10 MG tablet Take 10 mg by mouth at bedtime.    Marland Kitchen. escitalopram (LEXAPRO) 5 MG tablet Take 5 mg by mouth daily.     . fenofibrate (TRICOR) 145 MG tablet Take 145 mg by mouth daily.    . hydroxypropyl methylcellulose / hypromellose (ISOPTO TEARS / GONIOVISC) 2.5 % ophthalmic solution 1 drop 2 (two) times daily.    . insulin glargine (LANTUS) 100 UNIT/ML injection 14 Units 2 (two) times daily.     . insulin lispro (ADMELOG) 100 UNIT/ML injection Inject into the skin. Sliding scale    . ketotifen (ZADITOR) 0.025 % ophthalmic solution Place 1 drop into both eyes 2 (two) times daily.     Marland Kitchen. lisinopril (PRINIVIL,ZESTRIL) 5 MG tablet Take 10 mg by mouth daily.     . metFORMIN (GLUCOPHAGE) 1000 MG tablet Take 1,000 mg by mouth 2 (two) times daily with a meal.    . polyethylene glycol (MIRALAX / GLYCOLAX) packet Take 17 g by mouth daily.    . fluorouracil (EFUDEX) 5 % cream Reported on 10/20/2015     No current facility-administered medications for this visit.     Review of  Systems Review of Systems  Constitutional: Negative.   Respiratory: Negative.   Cardiovascular: Negative.   Gastrointestinal: Negative for abdominal pain.    Blood pressure (!) 185/70, pulse (!) 49, temperature (!) 96.3 F (35.7 C), temperature source Skin, resp. rate 20, height 5\' 7"  (1.702 m), weight 179 lb (81.2 kg), SpO2 93 %.  Physical Exam Physical Exam  Constitutional: He appears well-developed and well-nourished.  HENT:  Mouth/Throat: Oropharynx is clear and moist.  Neck: Neck supple.  Genitourinary:     Neurological: He is alert.  Skin: Skin is warm and dry.  Psychiatric: His behavior is normal.    Data Reviewed No recent laboratory studies.  Assessment    Huge, asymptomatic scrotal hernia.    Plan    Discussed options surgery vs observation.  The patient is really poorly able to cooperate in his care.  His daughter reports that  he pulled his Foley out many times before they placed a suprapubic catheter (subsequently removed).  The fascial defect is large, and I think the long-term risk for acute incarceration is small, although not 0.  With the patient's dementia, now a year 7-8, and multiple medical comorbidities, I think he probably be best served by observation.  Should he become acutely obstructed, it would be a hard decision of whether to operate at that time or not based on his pronounced decline over the last 2 years, especially since his wife passed last year.  At this time the patient's daughter, his power of attorney, is comfortable with observation.   The patient's is aware to call back for any questions or new concerns.      HPI, Physical Exam, Assessment and Plan have been scribed under the direction and in the presence of Earline Mayotte, MD. Dorathy Daft, RN  I have completed the exam and reviewed the above documentation for accuracy and completeness.  I agree with the above.  Museum/gallery conservator has been used and any errors in dictation or transcription are unintentional.  Donnalee Curry, M.D., F.A.C.S.  Merrily Pew Ryler Laskowski 06/13/2018, 10:31 AM

## 2018-06-13 DIAGNOSIS — K4090 Unilateral inguinal hernia, without obstruction or gangrene, not specified as recurrent: Secondary | ICD-10-CM | POA: Insufficient documentation

## 2018-09-16 DEATH — deceased

## 2019-10-27 IMAGING — US US SCROTUM W/ DOPPLER COMPLETE
1 series · 13 of 25 positions shown · non-contrast
Comparison: None.

CLINICAL DATA: Marked scrotal swelling for uncertain duration.
Limited clinical history available.

EXAM:
SCROTAL ULTRASOUND
DOPPLER ULTRASOUND OF THE TESTICLES
TECHNIQUE: Complete ultrasound examination of the testicles, epididymis, and
other scrotal structures was performed. Color and spectral Doppler
ultrasound were also utilized to evaluate blood flow to the
testicles.

[Series 1: us scrotum w/ doppler complete · 0.13mm/px · 61 acquisitions, 13 frames shown]
[im 1/61]
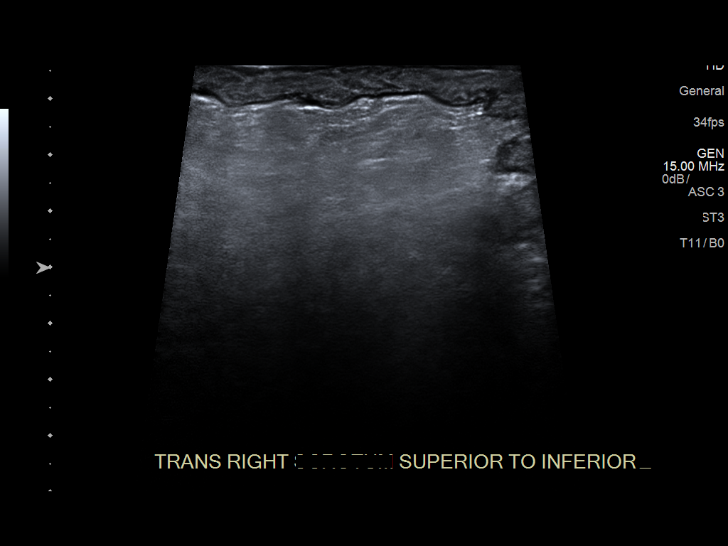
[im 6/61]
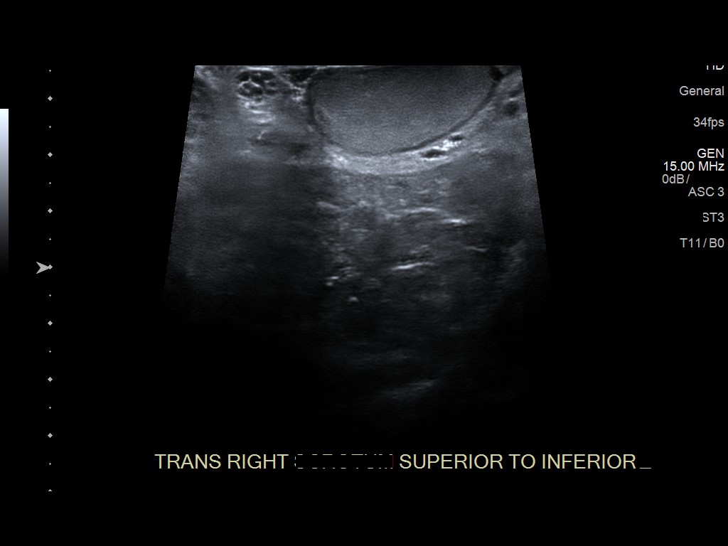
[im 11/61]
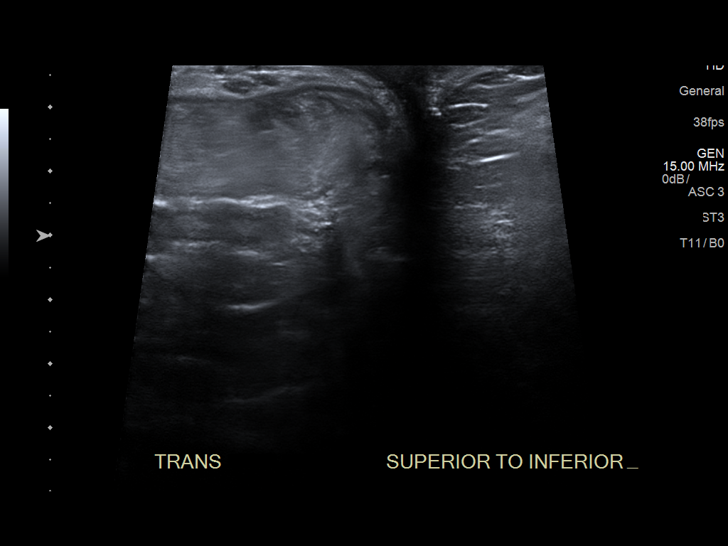
[im 16/61]
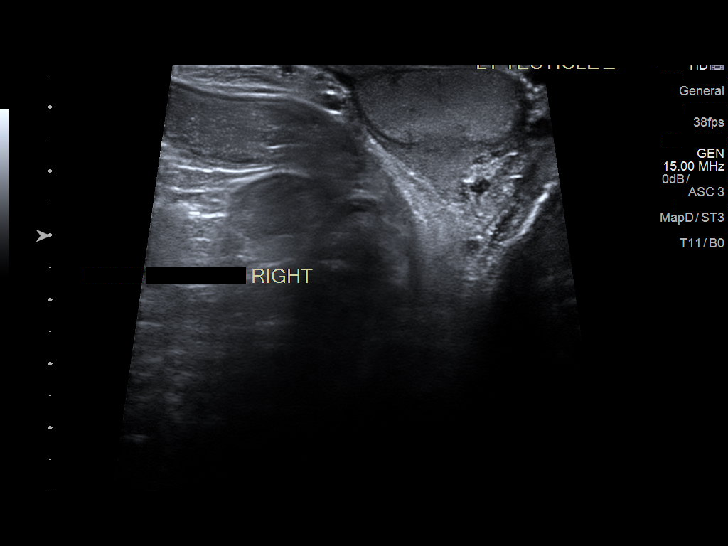
[im 21/61]
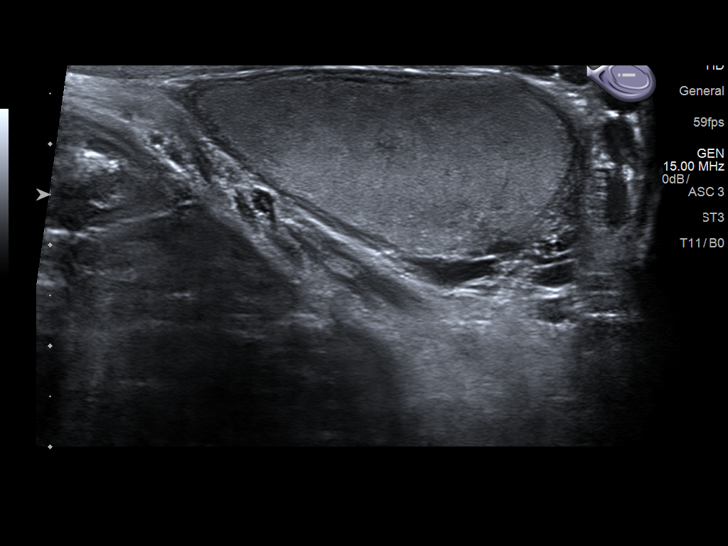
[im 26/61]
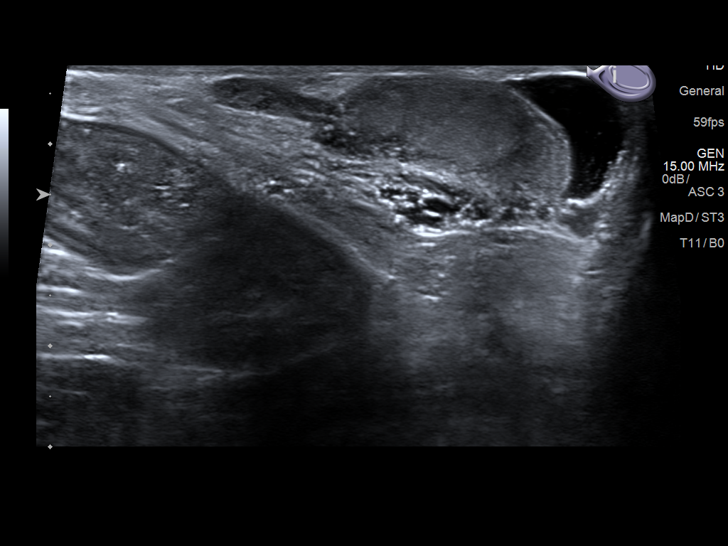
[im 31/61]
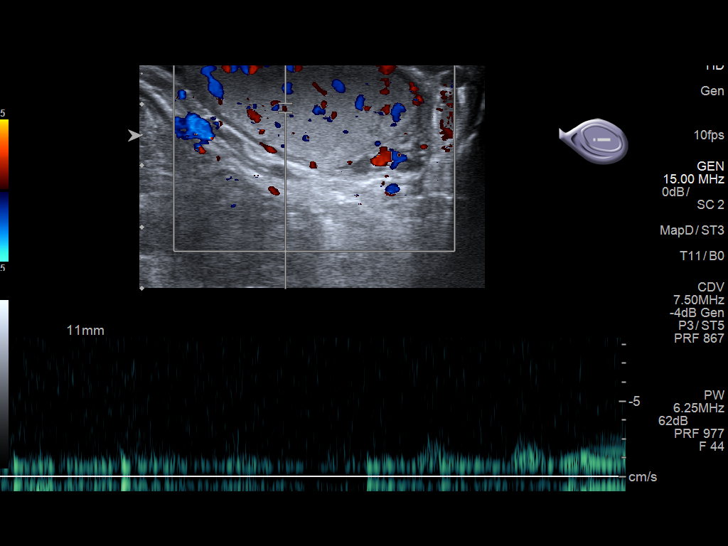
[im 36/61]
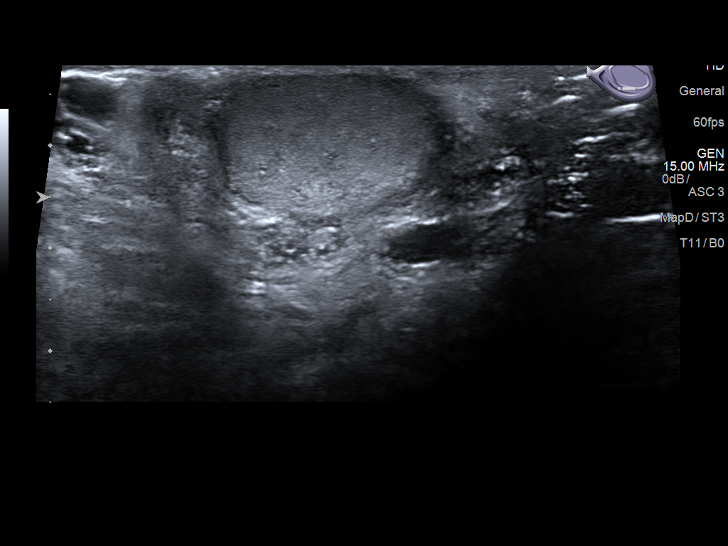
[im 41/61]
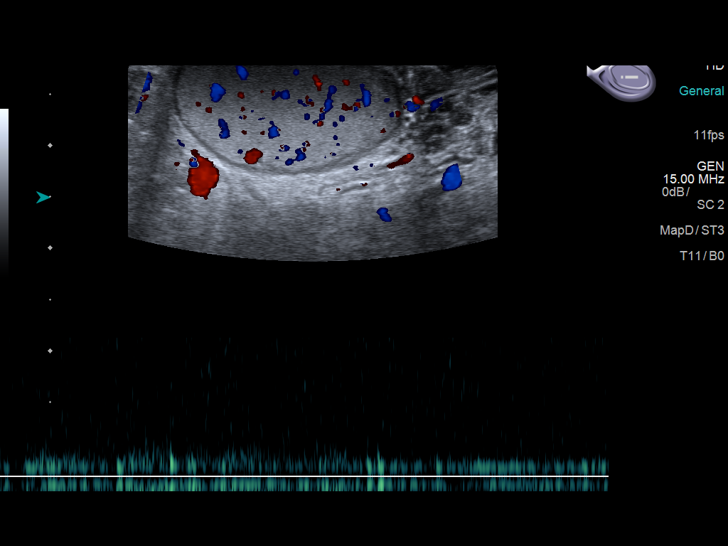
[im 46/61]
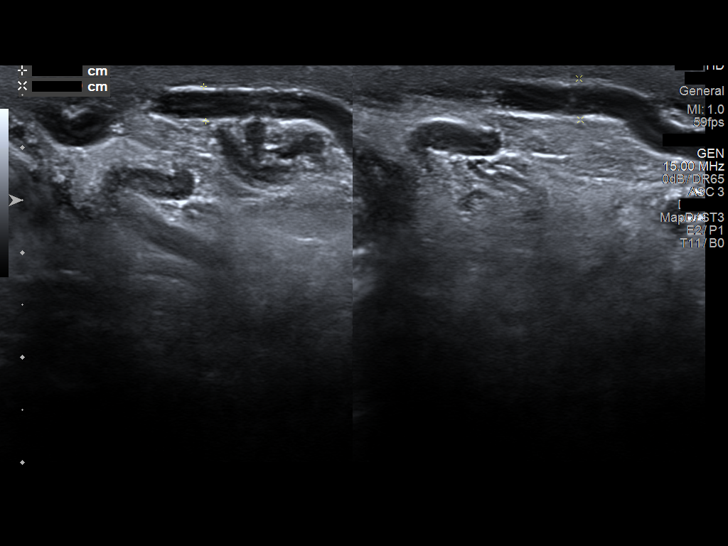
[im 51/61]
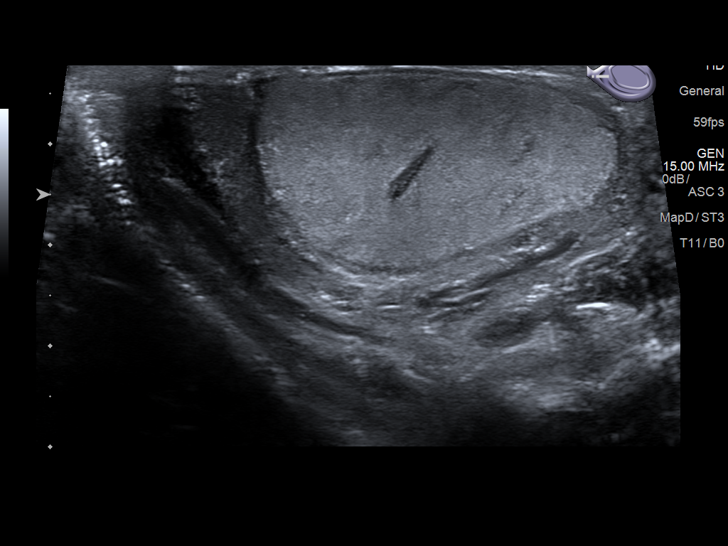
[im 56/61]
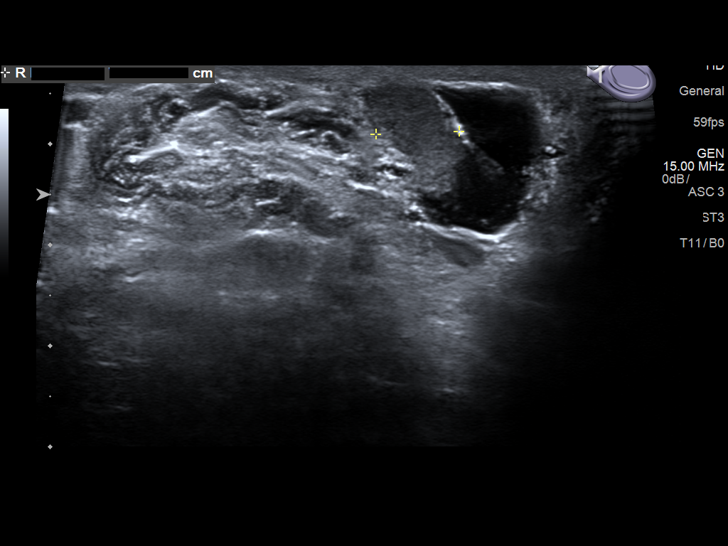
[im 61/61]
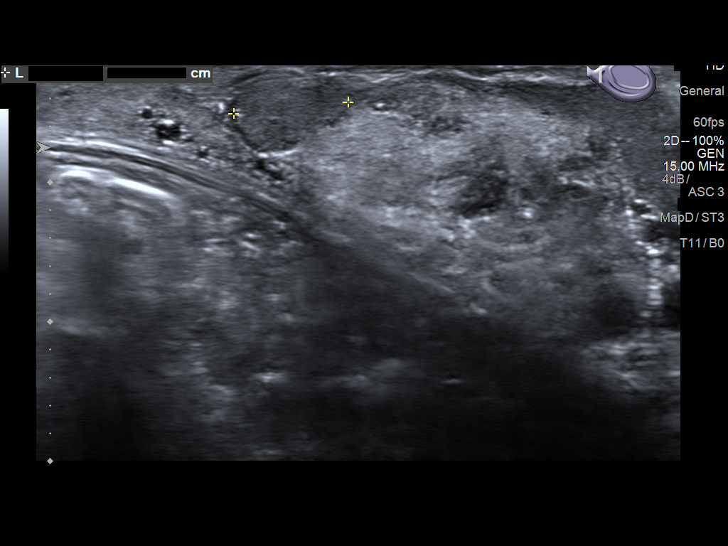

[13 of 25 positions shown; findings below may reference images not displayed]

FINDINGS: Right testicle

Measurements: 3.6 x 1.8 x 3.5 cm. No mass or microlithiasis
visualized.

Left testicle

Measurements: 3.5 x 2.0 x 2.8 cm. No mass or microlithiasis
visualized.

Right epididymis:  Normal in size and appearance.

Left epididymis:  Normal in size and appearance.

Hydrocele:  There is a small right-sided hydrocele.

Varicocele:  There are bilateral varicoceles.

Pulsed Doppler interrogation of both testes demonstrates normal low
resistance arterial and venous waveforms bilaterally.

There is a large amount of peristalsing small bowel within the
scrotum.
IMPRESSION: Large right inguinal hernia extending into the right aspect of the
scrotum which appears responsible for the scrotal enlargement.
Prominent varicoceles bilaterally with small right-sided hydrocele.
Normal appearance of the testes and epididymal structures with
normal vascularity. No findings suspicious for malignancy.
